# Patient Record
Sex: Male | Born: 1995 | Hispanic: Yes | Marital: Single | Smoking: Never smoker
Health system: Southern US, Community
[De-identification: ages and names within clinical notes are randomized; demographics above are authoritative.]

## PROBLEM LIST (undated history)

## (undated) DIAGNOSIS — F909 Attention-deficit hyperactivity disorder, unspecified type: Secondary | ICD-10-CM

## (undated) HISTORY — PX: TONSILLECTOMY: SUR1361

## (undated) HISTORY — DX: Attention-deficit hyperactivity disorder, unspecified type: F90.9

---
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
## 2015-05-25 ENCOUNTER — Ambulatory Visit (INDEPENDENT_AMBULATORY_CARE_PROVIDER_SITE_OTHER): Payer: Managed Care, Other (non HMO) | Admitting: Family Medicine

## 2015-05-25 ENCOUNTER — Encounter: Payer: Self-pay | Admitting: Family Medicine

## 2015-05-25 VITALS — BP 111/66 | HR 65 | Temp 98.7°F | Ht 65.5 in | Wt 243.2 lb

## 2015-05-25 DIAGNOSIS — E669 Obesity, unspecified: Secondary | ICD-10-CM | POA: Diagnosis not present

## 2015-05-25 DIAGNOSIS — G4700 Insomnia, unspecified: Secondary | ICD-10-CM | POA: Diagnosis not present

## 2015-05-25 DIAGNOSIS — E049 Nontoxic goiter, unspecified: Secondary | ICD-10-CM | POA: Insufficient documentation

## 2015-05-25 DIAGNOSIS — F902 Attention-deficit hyperactivity disorder, combined type: Secondary | ICD-10-CM | POA: Diagnosis not present

## 2015-05-25 DIAGNOSIS — Z0000 Encounter for general adult medical examination without abnormal findings: Secondary | ICD-10-CM

## 2015-05-25 DIAGNOSIS — G43009 Migraine without aura, not intractable, without status migrainosus: Secondary | ICD-10-CM

## 2015-05-25 DIAGNOSIS — F909 Attention-deficit hyperactivity disorder, unspecified type: Secondary | ICD-10-CM | POA: Insufficient documentation

## 2015-05-25 MED ORDER — TRAZODONE HCL 100 MG PO TABS: 50.0000 mg | ORAL_TABLET | Freq: Every evening | ORAL | Status: DC | PRN

## 2015-05-25 NOTE — Progress Notes (Signed)
BP 111/66 mmHg  Pulse 65  Temp(Src) 98.7 F (37.1 C)  Ht 5' 5.5" (1.664 m)  Wt 243 lb 3.2 oz (110.315 kg)  BMI 39.84 kg/m2  SpO2 99%   Subjective:    Patient ID: Peter Petersen, male    DOB: 03/26/1996, 19 y.o.   MRN: [id number]  HPI: Peter Petersen is a 19 y.o. male  Chief Complaint  Patient presents with  . Establish Care  . Medication Refill    ADHD medication  . Insomnia   He is here to officially establish care but says he had been here for sports physical back in high school (we have no record in our medical records system)  He started school this semester; the insomnia devastated him; he was using a lot of caffeine during school; mother thought maybe doing too much [employer]; then stopped coffee, now just occasionally; occasional soft drinks; no energy drinks; no hypersexuality, no gambling, no known bipolar in the family; when waking, does not feel rested; tried OTC sleep aides; no alcohol; no cigarettes; used to snore and had tonsils removed  He has been on ADHD medicine for years before; from 3rd grade to 9th grade; was not a big concern in high school did not need the medicine then; playing sports and had the energy outlet; treated by pediatrician, [facility]; he cannot recall the medicine or the dose Now [facility]; just does not have the time to exercise He is not sure if anyone else in the family has ADHD He took Metadate CD 30 mg, back in 2011 and 2012; he does not recall having any headaches or loss of appetite  He used to take medicine for migraines; he does not recall what that was; he does not get the aura; on and off, maybe 4-5 a month; the worst one recently was January in [place], bad one, grandfather passed away, lots of stress; photophobia and nausea; does not take medicine to sleep off the migraine; took pills to prevent them, prescribed by [facility]  Obesity; 190 pounds when he graduated from high school; he wasn't exercising as much as he  used to  Relevant past medical, surgical, family and social history reviewed and updated as indicated. Interim medical history since our last visit reviewed. No known family hx of diabetes Paternal grandmother has problem with blood circulation, not sure if clots or what exactly Allergies and medications reviewed and updated.  Review of Systems Per HPI unless specifically indicated above     Objective:    BP 111/66 mmHg  Pulse 65  Temp(Src) 98.7 F (37.1 C)  Ht 5' 5.5" (1.664 m)  Wt 243 lb 3.2 oz (110.315 kg)  BMI 39.84 kg/m2  SpO2 99%  Wt Readings from Last 3 Encounters:  05/25/15 243 lb 3.2 oz (110.315 kg) (99 %*, Z = 2.30)   * Growth percentiles are based on CDC 2-20 Years data.    Physical Exam  Constitutional: He appears well-developed and well-nourished. No distress.  obese  HENT:  Mouth/Throat: Mucous membranes are normal.  Eyes: EOM are normal. No scleral icterus.  Neck: Thyromegaly (prominent thyroid gland, symmetric, nontender) present.  Cardiovascular: Normal rate and regular rhythm.   Pulmonary/Chest: Effort normal and breath sounds normal.  Abdominal: Soft. He exhibits no distension (but obese). There is no tenderness.  Musculoskeletal: He exhibits no edema.  Neurological: He is alert. He displays no tremor.  Skin: No rash noted.  Psychiatric: He has a normal mood and  affect.      Assessment & Plan:   Problem List Items Addressed This Visit      Cardiovascular and Mediastinum   Migraine without aura    Request records from last primary to see what he used to take; avoid triggers      Relevant Medications   traZODone (DESYREL) 100 MG tablet     Endocrine   Goiter    Will start with labs; re-evaluate at f/u      Relevant Orders   T4, free (Completed)   T3, free (Completed)   TSH (Completed)   Thyroid peroxidase antibody (Completed)     Other   ADHD (attention deficit hyperactivity disorder)    Request records from previous primary; will  get labs today to make sure issues aren't related to thyroid disorder; will dicsuss risks/benefits of stimulant medication and consider starting at f/u after review of previous records; will need controlled substance contract at next visit      Insomnia - Primary    Will avoid benzos, sedative hypnotics; willing to use trazodone to help with sleep; good sleep hygiene      Obesity (BMI 35.0-39.9 without comorbidity) (HCC)    Stressed importance of healthy eating, increasing activity; see AVS; check thyroid function       Other Visit Diagnoses    Health maintenance examination        Relevant Orders    Lipid Panel w/o Chol/HDL Ratio (Completed)    Comprehensive metabolic panel (Completed)       Follow up plan: Return 2-3 weeks, for complete physical and other issues.  An after-visit summary was printed and given to the patient at check-out.  Please see the patient instructions which may contain other information and recommendations beyond what is mentioned above in the assessment and plan. Orders Placed This Encounter  Procedures  . T4, free  . T3, free  . Lipid Panel w/o Chol/HDL Ratio  . TSH  . Comprehensive metabolic panel  . Thyroid peroxidase antibody

## 2015-05-25 NOTE — Patient Instructions (Addendum)
Try the new medicine at bedtime called trazodone for sleep  Check out the information at familydoctor.org entitled "What It Takes to Lose Weight" Try to lose between 1-2 pounds per week by taking in fewer calories and burning off more calories You can succeed by limiting portions, limiting foods dense in calories and fat, becoming more active, and drinking 8 glasses of water a day (64 ounces) Don't skip meals, especially breakfast, as skipping meals may alter your metabolism Do not use over-the-counter weight loss pills or gimmicks that claim rapid weight loss A healthy BMI (or body mass index) is between 18.5 and 24.9 You can calculate your ideal BMI at the NIH website JobEconomics.huhttp://www.nhlbi.nih.gov/health/educational/lose_wt/BMI/bmicalc.htm Myfitnesspal Try to limit your daily calories to 2200 kcal a day Request information (records) from [facility] Return for a physical in the next few weeks and also to see how the sleep medicine worked and to start ADHD medicine, 45 minute block  Insomnia Insomnia is a sleep disorder that makes it difficult to fall asleep or to stay asleep. Insomnia can cause tiredness (fatigue), low energy, difficulty concentrating, mood swings, and poor performance at work or school.  There are three different ways to classify insomnia:  Difficulty falling asleep.  Difficulty staying asleep.  Waking up too early in the morning. Any type of insomnia can be long-term (chronic) or short-term (acute). Both are common. Short-term insomnia usually lasts for three months or less. Chronic insomnia occurs at least three times a week for longer than three months. CAUSES  Insomnia may be caused by another condition, situation, or substance, such as:  Anxiety.  Certain medicines.  Gastroesophageal reflux disease (GERD) or other gastrointestinal conditions.  Asthma or other breathing conditions.  Restless legs syndrome, sleep apnea, or other sleep disorders.  Chronic  pain.  Menopause. This may include hot flashes.  Stroke.  Abuse of alcohol, tobacco, or illegal drugs.  Depression.  Caffeine.   Neurological disorders, such as Alzheimer disease.  An overactive thyroid (hyperthyroidism). The cause of insomnia may not be known. RISK FACTORS Risk factors for insomnia include:  Gender. Women are more commonly affected than men.  Age. Insomnia is more common as you get older.  Stress. This may involve your professional or personal life.  Income. Insomnia is more common in people with lower income.  Lack of exercise.   Irregular work schedule or night shifts.  Traveling between different time zones. SIGNS AND SYMPTOMS If you have insomnia, trouble falling asleep or trouble staying asleep is the main symptom. This may lead to other symptoms, such as:  Feeling fatigued.  Feeling nervous about going to sleep.  Not feeling rested in the morning.  Having trouble concentrating.  Feeling irritable, anxious, or depressed. TREATMENT  Treatment for insomnia depends on the cause. If your insomnia is caused by an underlying condition, treatment will focus on addressing the condition. Treatment may also include:   Medicines to help you sleep.  Counseling or therapy.  Lifestyle adjustments. HOME CARE INSTRUCTIONS   Take medicines only as directed by your health care provider.  Keep regular sleeping and waking hours. Avoid naps.  Keep a sleep diary to help you and your health care provider figure out what could be causing your insomnia. Include:   When you sleep.  When you wake up during the night.  How well you sleep.   How rested you feel the next day.  Any side effects of medicines you are taking.  What you eat and drink.  Make your bedroom a comfortable place where it is easy to fall asleep:  Put up shades or special blackout curtains to block light from outside.  Use a white noise machine to block noise.  Keep the  temperature cool.   Exercise regularly as directed by your health care provider. Avoid exercising right before bedtime.  Use relaxation techniques to manage stress. Ask your health care provider to suggest some techniques that may work well for you. These may include:  Breathing exercises.  Routines to release muscle tension.  Visualizing peaceful scenes.  Cut back on alcohol, caffeinated beverages, and cigarettes, especially close to bedtime. These can disrupt your sleep.  Do not overeat or eat spicy foods right before bedtime. This can lead to digestive discomfort that can make it hard for you to sleep.  Limit screen use before bedtime. This includes:  Watching TV.  Using your smartphone, tablet, and computer.  Stick to a routine. This can help you fall asleep faster. Try to do a quiet activity, brush your teeth, and go to bed at the same time each night.  Get out of bed if you are still awake after 15 minutes of trying to sleep. Keep the lights down, but try reading or doing a quiet activity. When you feel sleepy, go back to bed.  Make sure that you drive carefully. Avoid driving if you feel very sleepy.  Keep all follow-up appointments as directed by your health care provider. This is important. SEEK MEDICAL CARE IF:   You are tired throughout the day or have trouble in your daily routine due to sleepiness.  You continue to have sleep problems or your sleep problems get worse. SEEK IMMEDIATE MEDICAL CARE IF:   You have serious thoughts about hurting yourself or someone else.   This information is not intended to replace advice given to you by your health care provider. Make sure you discuss any questions you have with your health care provider.   Document Released: 07/11/2000 Document Revised: 04/04/2015 Document Reviewed: 04/14/2014 Elsevier Interactive Patient Education [employer]

## 2015-05-26 ENCOUNTER — Encounter: Payer: Self-pay | Admitting: Family Medicine

## 2015-05-26 DIAGNOSIS — E786 Lipoprotein deficiency: Secondary | ICD-10-CM | POA: Insufficient documentation

## 2015-05-26 LAB — TSH: TSH: 1.21 u[IU]/mL (ref 0.450–4.500)

## 2015-05-26 LAB — COMPREHENSIVE METABOLIC PANEL
A/G RATIO: 1.7 (ref 1.1–2.5)
ALT: 24 IU/L (ref 0–44)
AST: 34 IU/L (ref 0–40)
Albumin: 4.5 g/dL (ref 3.5–5.5)
Alkaline Phosphatase: 99 IU/L (ref 39–117)
BUN/Creatinine Ratio: 17 (ref 8–19)
BUN: 15 mg/dL (ref 6–20)
Bilirubin Total: 0.7 mg/dL (ref 0.0–1.2)
CALCIUM: 9.6 mg/dL (ref 8.7–10.2)
CO2: 25 mmol/L (ref 18–29)
CREATININE: 0.86 mg/dL (ref 0.76–1.27)
Chloride: 100 mmol/L (ref 97–106)
GFR calc Af Amer: 145 mL/min/{1.73_m2} (ref >59)
GFR, EST NON AFRICAN AMERICAN: 126 mL/min/{1.73_m2} (ref >59)
Globulin, Total: 2.6 g/dL (ref 1.5–4.5)
Glucose: 85 mg/dL (ref 65–99)
Potassium: 5 mmol/L (ref 3.5–5.2)
Sodium: 140 mmol/L (ref 136–144)
Total Protein: 7.1 g/dL (ref 6.0–8.5)

## 2015-05-26 LAB — T4, FREE: Free T4: 1.17 ng/dL (ref 0.93–1.60)

## 2015-05-26 LAB — THYROID PEROXIDASE ANTIBODY: Thyroperoxidase Ab SerPl-aCnc: 8 IU/mL (ref 0–26)

## 2015-05-26 LAB — LIPID PANEL W/O CHOL/HDL RATIO
Cholesterol, Total: 140 mg/dL (ref 100–169)
HDL: 34 mg/dL — ABNORMAL LOW (ref >39)
LDL CALC: 81 mg/dL (ref 0–109)
Triglycerides: 125 mg/dL — ABNORMAL HIGH (ref 0–89)
VLDL Cholesterol Cal: 25 mg/dL (ref 5–40)

## 2015-05-26 LAB — T3, FREE: T3, Free: 3.9 pg/mL (ref 2.3–5.0)

## 2015-05-30 NOTE — Assessment & Plan Note (Signed)
Will avoid benzos, sedative hypnotics; willing to use trazodone to help with sleep; good sleep hygiene

## 2015-05-30 NOTE — Assessment & Plan Note (Signed)
Stressed importance of healthy eating, increasing activity; see AVS; check thyroid function

## 2015-05-30 NOTE — Assessment & Plan Note (Signed)
Request records from previous primary; will get labs today to make sure issues aren't related to thyroid disorder; will dicsuss risks/benefits of stimulant medication and consider starting at f/u after review of previous records; will need controlled substance contract at next visit

## 2015-05-30 NOTE — Assessment & Plan Note (Signed)
Will start with labs; re-evaluate at f/u

## 2015-05-30 NOTE — Assessment & Plan Note (Signed)
Request records from last primary to see what he used to take; avoid triggers

## 2015-06-13 ENCOUNTER — Encounter: Payer: Self-pay | Admitting: Family Medicine

## 2015-06-13 ENCOUNTER — Ambulatory Visit (INDEPENDENT_AMBULATORY_CARE_PROVIDER_SITE_OTHER): Payer: Managed Care, Other (non HMO) | Admitting: Family Medicine

## 2015-06-13 VITALS — BP 117/69 | HR 64 | Temp 97.1°F | Ht 66.5 in | Wt 246.0 lb

## 2015-06-13 DIAGNOSIS — G43009 Migraine without aura, not intractable, without status migrainosus: Secondary | ICD-10-CM | POA: Diagnosis not present

## 2015-06-13 DIAGNOSIS — Z0000 Encounter for general adult medical examination without abnormal findings: Secondary | ICD-10-CM

## 2015-06-13 DIAGNOSIS — Z114 Encounter for screening for human immunodeficiency virus [HIV]: Secondary | ICD-10-CM

## 2015-06-13 DIAGNOSIS — R040 Epistaxis: Secondary | ICD-10-CM

## 2015-06-13 DIAGNOSIS — F321 Major depressive disorder, single episode, moderate: Secondary | ICD-10-CM | POA: Diagnosis not present

## 2015-06-13 DIAGNOSIS — G245 Blepharospasm: Secondary | ICD-10-CM

## 2015-06-13 DIAGNOSIS — E669 Obesity, unspecified: Secondary | ICD-10-CM

## 2015-06-13 DIAGNOSIS — Z113 Encounter for screening for infections with a predominantly sexual mode of transmission: Secondary | ICD-10-CM | POA: Diagnosis not present

## 2015-06-13 DIAGNOSIS — F902 Attention-deficit hyperactivity disorder, combined type: Secondary | ICD-10-CM | POA: Diagnosis not present

## 2015-06-13 MED ORDER — METHYLPHENIDATE HCL ER (CD) 20 MG PO CPCR: 20.0000 mg | ORAL_CAPSULE | ORAL | Status: DC

## 2015-06-13 MED ORDER — ESCITALOPRAM OXALATE 10 MG PO TABS: 10.0000 mg | ORAL_TABLET | Freq: Every day | ORAL | Status: DC

## 2015-06-13 MED ORDER — NORTRIPTYLINE HCL 10 MG PO CAPS: 10.0000 mg | ORAL_CAPSULE | Freq: Every day | ORAL | Status: DC

## 2015-06-13 NOTE — Assessment & Plan Note (Signed)
Start metadate CD; return in 3 weeks for reassessment

## 2015-06-13 NOTE — Assessment & Plan Note (Signed)
Start nortriptyline, keep record of headaches, return in 3 weeks

## 2015-06-13 NOTE — Assessment & Plan Note (Addendum)
USPSTF grade A and B recommendations reviewed with patient; age-appropriate recommendations, preventive care, screening tests, etc discussed and encouraged; healthy living encouraged; see AVS for patient education given to patient; he refuses flu shot; he refuses tetanus booster; see AVS; encouraged regular STE and refer to urology for any new lumps

## 2015-06-13 NOTE — Patient Instructions (Addendum)
Please do start the new medicine and take in the morning; if it makes you tired, then take it at night instead Do seek immediate help (call 911 or go to the ER) if any dark thoughts or thoughts of hurting yourself Return to see me in 3 weeks Check out the information at familydoctor.org entitled "What It Takes to Lose Weight" Try to lose between 1-2 pounds per week by taking in fewer calories and burning off more calories You can succeed by limiting portions, limiting foods dense in calories and fat, becoming more active, and drinking 8 glasses of water a day (64 ounces) Don't skip meals, especially breakfast, as skipping meals may alter your metabolism Do not use over-the-counter weight loss pills or gimmicks that claim rapid weight loss A healthy BMI (or body mass index) is between 18.5 and 24.9 You can calculate your ideal BMI at the NIH website JobEconomics.huhttp://www.nhlbi.nih.gov/health/educational/lose_wt/BMI/bmicalc.htm  I do recommend yearly flu shots; for individuals who don't want flu shots, try to practice excellent hand hygiene, and avoid nursing homes, day cares, and hospitals during peak flu season; taking 1000 mg of vitamin C daily during flu/cold season may help boost your immune system too  Start the new medicine at night for migraines; keep a record of how many you have and we can adjust the dose at your next appointment  Start the new medicine for ADHD; we'll see how that is working at follow-up as well  Return in 3 weeks for medicine follow-up Return in 12+ months for your next physical  Health Maintenance, Male A healthy lifestyle and preventative care can promote health and wellness.  Maintain regular health, dental, and eye exams.  Eat a healthy diet. Foods like vegetables, fruits, whole grains, low-fat dairy products, and lean protein foods contain the nutrients you need and are low in calories. Decrease your intake of foods high in solid fats, added sugars, and salt. Get  information about a proper diet from your health care provider, if necessary.  Regular physical exercise is one of the most important things you can do for your health. Most adults should get at least 150 minutes of moderate-intensity exercise (any activity that increases your heart rate and causes you to sweat) each week. In addition, most adults need muscle-strengthening exercises on 2 or more days a week.   Maintain a healthy weight. The body mass index (BMI) is a screening tool to identify possible weight problems. It provides an estimate of body fat based on height and weight. Your health care provider can find your BMI and can help you achieve or maintain a healthy weight. For males 20 years and older:  A BMI below 18.5 is considered underweight.  A BMI of 18.5 to 24.9 is normal.  A BMI of 25 to 29.9 is considered overweight.  A BMI of 30 and above is considered obese.  Maintain normal blood lipids and cholesterol by exercising and minimizing your intake of saturated fat. Eat a balanced diet with plenty of fruits and vegetables. Blood tests for lipids and cholesterol should begin at age 19 and be repeated every 5 years. If your lipid or cholesterol levels are high, you are over age 19, or you are at high risk for heart disease, you may need your cholesterol levels checked more frequently.Ongoing high lipid and cholesterol levels should be treated with medicines if diet and exercise are not working.  If you smoke, find out from your health care provider how to quit. If you do not  use tobacco, do not start.  Lung cancer screening is recommended for adults aged 55-80 years who are at high risk for developing lung cancer because of a history of smoking. A yearly low-dose CT scan of the lungs is recommended for people who have at least a 30-pack-year history of smoking and are current smokers or have quit within the past 15 years. A pack year of smoking is smoking an average of 1 pack of  cigarettes a day for 1 year (for example, a 30-pack-year history of smoking could mean smoking 1 pack a day for 30 years or 2 packs a day for 15 years). Yearly screening should continue until the smoker has stopped smoking for at least 15 years. Yearly screening should be stopped for people who develop a health problem that would prevent them from having lung cancer treatment.  If you choose to drink alcohol, do not have more than 2 drinks per day. One drink is considered to be 12 oz (360 mL) of beer, 5 oz (150 mL) of wine, or 1.5 oz (45 mL) of liquor.  Avoid the use of street drugs. Do not share needles with anyone. Ask for help if you need support or instructions about stopping the use of drugs.  High blood pressure causes heart disease and increases the risk of stroke. High blood pressure is more likely to develop in:  People who have blood pressure in the end of the normal range (100-139/85-89 mm Hg).  People who are overweight or obese.  People who are African American.  If you are 16-46 years of age, have your blood pressure checked every 3-5 years. If you are 3 years of age or older, have your blood pressure checked every year. You should have your blood pressure measured twice--once when you are at a hospital or clinic, and once when you are not at a hospital or clinic. Record the average of the two measurements. To check your blood pressure when you are not at a hospital or clinic, you can use:  An automated blood pressure machine at a pharmacy.  A home blood pressure monitor.  If you are 58-54 years old, ask your health care provider if you should take aspirin to prevent heart disease.  Diabetes screening involves taking a blood sample to check your fasting blood sugar level. This should be done once every 3 years after age 9 if you are at a normal weight and without risk factors for diabetes. Testing should be considered at a younger age or be carried out more frequently if you are  overweight and have at least 1 risk factor for diabetes.  Colorectal cancer can be detected and often prevented. Most routine colorectal cancer screening begins at the age of 43 and continues through age 88. However, your health care provider may recommend screening at an earlier age if you have risk factors for colon cancer. On a yearly basis, your health care provider may provide home test kits to check for hidden blood in the stool. A small camera at the end of a tube may be used to directly examine the colon (sigmoidoscopy or colonoscopy) to detect the earliest forms of colorectal cancer. Talk to your health care provider about this at age 41 when routine screening begins. A direct exam of the colon should be repeated every 5-10 years through age 47, unless early forms of precancerous polyps or small growths are found.  People who are at an increased risk for hepatitis B should be screened  for this virus. You are considered at high risk for hepatitis B if:  You were born in a country where hepatitis B occurs often. Talk with your health care provider about which countries are considered high risk.  Your parents were born in a high-risk country and you have not received a shot to protect against hepatitis B (hepatitis B vaccine).  You have HIV or AIDS.  You use needles to inject street drugs.  You live with, or have sex with, someone who has hepatitis B.  You are a man who has sex with other men (MSM).  You get hemodialysis treatment.  You take certain medicines for conditions like cancer, organ transplantation, and autoimmune conditions.  Hepatitis C blood testing is recommended for all people born from [birth year] through 1965 and any individual with known risk factors for hepatitis C.  Healthy men should no longer receive prostate-specific antigen (PSA) blood tests as part of routine cancer screening. Talk to your health care provider about prostate cancer screening.  Testicular cancer  screening is not recommended for adolescents or adult males who have no symptoms. Screening includes self-exam, a health care provider exam, and other screening tests. Consult with your health care provider about any symptoms you have or any concerns you have about testicular cancer.  Practice safe sex. Use condoms and avoid high-risk sexual practices to reduce the spread of sexually transmitted infections (STIs).  You should be screened for STIs, including gonorrhea and chlamydia if:  You are sexually active and are younger than 24 years.  You are older than 24 years, and your health care provider tells you that you are at risk for this type of infection.  Your sexual activity has changed since you were last screened, and you are at an increased risk for chlamydia or gonorrhea. Ask your health care provider if you are at risk.  If you are at risk of being infected with HIV, it is recommended that you take a prescription medicine daily to prevent HIV infection. This is called pre-exposure prophylaxis (PrEP). You are considered at risk if:  You are a man who has sex with other men (MSM).  You are a heterosexual man who is sexually active with multiple partners.  You take drugs by injection.  You are sexually active with a partner who has HIV.  Talk with your health care provider about whether you are at high risk of being infected with HIV. If you choose to begin PrEP, you should first be tested for HIV. You should then be tested every 3 months for as long as you are taking PrEP.  Use sunscreen. Apply sunscreen liberally and repeatedly throughout the day. You should seek shade when your shadow is shorter than you. Protect yourself by wearing long sleeves, pants, a wide-brimmed hat, and sunglasses year round whenever you are outdoors.  Tell your health care provider of new moles or changes in moles, especially if there is a change in shape or color. Also, tell your health care provider if a  mole is larger than the size of a pencil eraser.  A one-time screening for abdominal aortic aneurysm (AAA) and surgical repair of large AAAs by ultrasound is recommended for men aged 65-75 years who are current or former smokers.  Stay current with your vaccines (immunizations).   This information is not intended to replace advice given to you by your health care provider. Make sure you discuss any questions you have with your health care provider.   Document  Released: 01/10/2008 Document Revised: 08/04/2014 Document Reviewed: 12/09/2010 Elsevier Interactive Patient Education [employer]

## 2015-06-13 NOTE — Assessment & Plan Note (Signed)
Encouraged weight loss, activity; see AVS

## 2015-06-13 NOTE — Addendum Note (Signed)
Addended byZella Ball: WORKMAN, AMY J on: 06/13/2015 09:00 AM   Modules accepted: Orders, SmartSet

## 2015-06-13 NOTE — Progress Notes (Signed)
Patient ID: Peter Petersen, male   DOB: [birth year], 19 y.o.   MRN: [id number]   Subjective:   Peter Petersen is a 19 y.o. male here for a complete physical exam  Interim issues since last visit:   USPSTF grade A and B recommendations Alcohol: none Depression:  Depression screen [facility] 2/9 06/13/2015 06/13/2015  Decreased Interest 2 2  Down, Depressed, Hopeless 1 1  PHQ - 2 Score 3 3  Altered sleeping 3 -  Tired, decreased energy 3 -  Change in appetite 3 -  Feeling bad or failure about yourself  2 -  Trouble concentrating 3 -  Moving slowly or fidgety/restless 2 -  Suicidal thoughts 0 -  PHQ-9 Score 19 -   Depression screen [facility] 2/9 06/13/2015 06/13/2015  Decreased Interest 2 2  Down, Depressed, Hopeless 1 1  PHQ - 2 Score 3 3  Altered sleeping 3 -  Tired, decreased energy 3 -  Change in appetite 3 -  Feeling bad or failure about yourself  2 -  Trouble concentrating 3 -  Moving slowly or fidgety/restless 2 -  Suicidal thoughts 0 -  PHQ-9 Score 19 -  Family hx: negative for depression and anxiety; but then he says uncle (paternal) committed suicide, but he was alcoholic he thinks, not sure if depressed  He has been through episodes of depression before, did therapy; not interested in therapy Hypertension: controlled Obesity: relatively stable Tobacco use: nonsmoker HIV, hep B, hep C: no unprotected sex; he is open to testing STD testing and prevention (chl/gon/syphilis): GC, chl; no questions about how to stay safe Lipids: low HDL Glucose: normal, 85 2 weeks ago Diet: tried changing diet Exercise: increasing activity, going to gym Skin cancer: no tanning beds, no worrisome moles  Migraines and ADHD; used to take metadate CD 30 mg; would like to start something now, classes will be starting in a few months Migraines more than 10, less than 20 per month; used to take daily medicine, can't remember what   Past Medical History  Diagnosis Date  . ADHD (attention deficit  hyperactivity disorder)    Past Surgical History  Procedure Laterality Date  . Tonsillectomy     Family History  Problem Relation Age of Onset  . Heart disease Maternal Grandfather   . Cancer Neg Hx   . Diabetes Neg Hx   . Hypertension Neg Hx   . Stroke Neg Hx    Social History  Substance Use Topics  . Smoking status: Never Smoker   . Smokeless tobacco: Never Used  . Alcohol Use: No   Review of Systems  Constitutional: Negative for fever and unexpected weight change.  HENT: Positive for nosebleeds and rhinorrhea (cold right now). Negative for dental problem and hearing loss.   Eyes: Positive for visual disturbance (wears glasses at night, close vision).  Respiratory: Negative for shortness of breath and wheezing.   Cardiovascular: Negative for chest pain and leg swelling.  Gastrointestinal: Negative for blood in stool.  Endocrine: Negative for polydipsia and polyuria.  Genitourinary: Negative for hematuria.  Musculoskeletal: Negative for joint swelling.  Skin:       No worrisome moles  Allergic/Immunologic: Negative for food allergies.  Neurological: Positive for tremors (occasionally, spasms in the muscles occasionally, blepharospasm). Negative for speech difficulty.  Psychiatric/Behavioral: Positive for dysphoric mood.    Objective:   Filed Vitals:   06/13/15 0802  BP: 117/69  Pulse: 64  Temp: 97.1 F (36.2 C)  Height: 5' 6.5" (1.689  m)  Weight: 246 lb (111.585 kg)  SpO2: 98%   Body mass index is 39.12 kg/(m^2). Wt Readings from Last 3 Encounters:  06/13/15 246 lb (111.585 kg) (99 %*, Z = 2.35)  05/25/15 243 lb 3.2 oz (110.315 kg) (99 %*, Z = 2.30)   * Growth percentiles are based on CDC 2-20 Years data.   Physical Exam  Constitutional: He appears well-developed and well-nourished. No distress.  HENT:  Head: Normocephalic and atraumatic.  Nose: Nose normal.  Mouth/Throat: Oropharynx is clear and moist.  Eyes: EOM are normal. No scleral icterus.  Neck:  No JVD present. No thyromegaly present.  Cardiovascular: Normal rate, regular rhythm and normal heart sounds.   Pulmonary/Chest: Effort normal and breath sounds normal. No respiratory distress. He has no wheezes. He has no rales.  Abdominal: Soft. Bowel sounds are normal. He exhibits no distension. There is no tenderness. There is no guarding.  Musculoskeletal: Normal range of motion. He exhibits no edema.  Lymphadenopathy:    He has no cervical adenopathy.  Neurological: He is alert. He displays normal reflexes. He exhibits normal muscle tone. Coordination normal.  Skin: Skin is warm and dry. No rash noted. He is not diaphoretic. No erythema. No pallor.  Psychiatric: He has a normal mood and affect. His behavior is normal. Judgment and thought content normal.    Assessment/Plan:   Problem List Items Addressed This Visit      Cardiovascular and Mediastinum   Migraine without aura    Start nortriptyline, keep record of headaches, return in 3 weeks      Relevant Medications   escitalopram (LEXAPRO) 10 MG tablet   nortriptyline (PAMELOR) 10 MG capsule     Respiratory   Epistaxis   Relevant Orders   PT and PTT   CBC with Differential/Platelet     Nervous and Auditory   Blepharospasm   Relevant Orders   Magnesium     Other   ADHD (attention deficit hyperactivity disorder)    Start metadate CD; return in 3 weeks for reassessment      Obesity (BMI 35.0-39.9 without comorbidity) (HCC)    Encouraged weight loss, activity; see AVS      Relevant Medications   methylphenidate (METADATE CD) 20 MG CR capsule   Preventative health care - Primary    USPSTF grade A and B recommendations reviewed with patient; age-appropriate recommendations, preventive care, screening tests, etc discussed and encouraged; healthy living encouraged; see AVS for patient education given to patient; he refuses flu shot; he refuses tetanus booster; see AVS; encouraged regular STE and refer to urology for any  new lumps      Screening for HIV without presence of risk factors   Relevant Orders   HIV antibody   Screen for STD (sexually transmitted disease)   Relevant Orders   GC/chlamydia probe amp, urine   Depression, major, single episode, moderate (HCC)    Patient refused counseling; not suicidal; he agrees to call 911 or go to the ER if any thoughts of self-harm; start antidepressant; no hx of mania or hypomania per hx; recheck PHQ scores at f/u      Relevant Medications   escitalopram (LEXAPRO) 10 MG tablet   nortriptyline (PAMELOR) 10 MG capsule   Other Relevant Orders   VITAMIN D 25 Hydroxy (Vit-D Deficiency, Fractures)      Meds ordered this encounter  Medications  . escitalopram (LEXAPRO) 10 MG tablet    Sig: Take 1 tablet (10 mg total) by mouth daily.  Dispense:  30 tablet    Refill:  0  . methylphenidate (METADATE CD) 20 MG CR capsule    Sig: Take 1 capsule (20 mg total) by mouth every morning.    Dispense:  30 capsule    Refill:  0  . nortriptyline (PAMELOR) 10 MG capsule    Sig: Take 1 capsule (10 mg total) by mouth at bedtime.    Dispense:  30 capsule    Refill:  0   Orders Placed This Encounter  Procedures  . Magnesium  . PT and PTT  . GC/chlamydia probe amp, urine  . HIV antibody  . CBC with Differential/Platelet  . VITAMIN D 25 Hydroxy (Vit-D Deficiency, Fractures)    Follow up plan: Return in about 3 weeks (around 07/04/2015) for follow-up; one year for physical.  An after-visit summary was printed and given to the patient at check-out.  Please see the patient instructions which may contain other information and recommendations beyond what is mentioned above in the assessment and plan.  Meds ordered this encounter  Medications  . escitalopram (LEXAPRO) 10 MG tablet    Sig: Take 1 tablet (10 mg total) by mouth daily.    Dispense:  30 tablet    Refill:  0  . methylphenidate (METADATE CD) 20 MG CR capsule    Sig: Take 1 capsule (20 mg total) by mouth  every morning.    Dispense:  30 capsule    Refill:  0  . nortriptyline (PAMELOR) 10 MG capsule    Sig: Take 1 capsule (10 mg total) by mouth at bedtime.    Dispense:  30 capsule    Refill:  0    Orders Placed This Encounter  Procedures  . Magnesium  . PT and PTT  . GC/chlamydia probe amp, urine  . HIV antibody  . CBC with Differential/Platelet  . VITAMIN D 25 Hydroxy (Vit-D Deficiency, Fractures)

## 2015-06-13 NOTE — Assessment & Plan Note (Signed)
Patient refused counseling; not suicidal; he agrees to call 911 or go to the ER if any thoughts of self-harm; start antidepressant; no hx of mania or hypomania per hx; recheck PHQ scores at f/u

## 2015-06-14 LAB — CBC WITH DIFFERENTIAL/PLATELET
BASOS ABS: 0 10*3/uL (ref 0.0–0.2)
BASOS: 0 %
EOS (ABSOLUTE): 0.1 10*3/uL (ref 0.0–0.4)
Eos: 2 %
Hematocrit: 43.8 % (ref 37.5–51.0)
Hemoglobin: 14.7 g/dL (ref 12.6–17.7)
Immature Grans (Abs): 0 10*3/uL (ref 0.0–0.1)
Immature Granulocytes: 0 %
Lymphocytes Absolute: 2.3 10*3/uL (ref 0.7–3.1)
Lymphs: 35 %
MCH: 29.3 pg (ref 26.6–33.0)
MCHC: 33.6 g/dL (ref 31.5–35.7)
MCV: 87 fL (ref 79–97)
MONOS ABS: 0.5 10*3/uL (ref 0.1–0.9)
Monocytes: 8 %
NEUTROS ABS: 3.7 10*3/uL (ref 1.4–7.0)
NEUTROS PCT: 55 %
PLATELETS: 281 10*3/uL (ref 150–379)
RBC: 5.01 x10E6/uL (ref 4.14–5.80)
RDW: 13.1 % (ref 12.3–15.4)
WBC: 6.7 10*3/uL (ref 3.4–10.8)

## 2015-06-14 LAB — MAGNESIUM: MAGNESIUM: 2.1 mg/dL (ref 1.6–2.3)

## 2015-06-14 LAB — PT AND PTT
APTT: 33 s (ref 24–33)
INR: 1 (ref 0.8–1.2)
PROTHROMBIN TIME: 10.2 s (ref 9.1–12.0)

## 2015-06-14 LAB — GC/CHLAMYDIA PROBE AMP
Chlamydia trachomatis, NAA: NEGATIVE
Neisseria gonorrhoeae by PCR: NEGATIVE

## 2015-06-14 LAB — VITAMIN D 25 HYDROXY (VIT D DEFICIENCY, FRACTURES): VIT D 25 HYDROXY: 13.7 ng/mL — AB (ref 30.0–100.0)

## 2015-06-14 LAB — HIV ANTIBODY (ROUTINE TESTING W REFLEX): HIV SCREEN 4TH GENERATION: NONREACTIVE

## 2015-06-19 ENCOUNTER — Telehealth: Payer: Self-pay | Admitting: Family Medicine

## 2015-06-19 DIAGNOSIS — E559 Vitamin D deficiency, unspecified: Secondary | ICD-10-CM

## 2015-06-19 MED ORDER — VITAMIN D (ERGOCALCIFEROL) 1.25 MG (50000 UNIT) PO CAPS: 50000.0000 [IU] | ORAL_CAPSULE | ORAL | Status: DC

## 2015-06-19 NOTE — Telephone Encounter (Signed)
No answer. Left message on patient's voicemail to return my call.

## 2015-06-19 NOTE — Telephone Encounter (Signed)
Let patient know please that his vitamin D is really low; that can cause fatigue and feeling down; start new prescription vitamin D once a week for four weeks, then 1,000 iu vitamin D3 daily (he can get that OTC) His other tests all look very good Thank you

## 2015-06-20 NOTE — Telephone Encounter (Signed)
Patient notified about Vitamin D and medications were explained. He said he had no questions. I told him to call us if he did.

## 2015-07-04 ENCOUNTER — Ambulatory Visit (INDEPENDENT_AMBULATORY_CARE_PROVIDER_SITE_OTHER): Payer: Managed Care, Other (non HMO) | Admitting: Family Medicine

## 2015-07-04 ENCOUNTER — Encounter: Payer: Self-pay | Admitting: Family Medicine

## 2015-07-04 VITALS — BP 110/69 | HR 66 | Wt 245.0 lb

## 2015-07-04 DIAGNOSIS — G4710 Hypersomnia, unspecified: Secondary | ICD-10-CM

## 2015-07-04 DIAGNOSIS — F902 Attention-deficit hyperactivity disorder, combined type: Secondary | ICD-10-CM

## 2015-07-04 DIAGNOSIS — E049 Nontoxic goiter, unspecified: Secondary | ICD-10-CM

## 2015-07-04 DIAGNOSIS — G43009 Migraine without aura, not intractable, without status migrainosus: Secondary | ICD-10-CM

## 2015-07-04 DIAGNOSIS — E559 Vitamin D deficiency, unspecified: Secondary | ICD-10-CM | POA: Diagnosis not present

## 2015-07-04 DIAGNOSIS — F321 Major depressive disorder, single episode, moderate: Secondary | ICD-10-CM | POA: Diagnosis not present

## 2015-07-04 DIAGNOSIS — R400 Somnolence: Secondary | ICD-10-CM

## 2015-07-04 MED ORDER — ESCITALOPRAM OXALATE 10 MG PO TABS: 15.0000 mg | ORAL_TABLET | Freq: Every day | ORAL | Status: DC

## 2015-07-04 NOTE — Progress Notes (Signed)
BP 110/69 mmHg  Pulse 66  Wt 245 lb (111.131 kg)  SpO2 98%   Subjective:    Patient ID: Peter Petersen, male    DOB: 12/09/1995, 19 y.o.   MRN: [id number]  HPI: Peter Petersen is a 19 y.o. male  Chief Complaint  Patient presents with  . ADHD    3 week follow up, he has not started the new medicine yet.   Patient here for ADHD follow-up; he will be starting the medicine when classes get underway  He is really tired in the morning; every morning; he does not wake up in the middle of the night; no coughing or sputtering or gasping at night; no body jerking as he falls asleep; he used to snore, but when tonsils were removed, that stopped; no episodes of known apnea No known family hx of sleep apnea Weight is same range He has low vitamin D, taking 50k units every Sunday No thoughts of self-harm or thoughts of hurting others; denies hallucinations, voices, etc.  He feels like his mood is doing better; went up a little and then plateaued; no headaches or nausea  The migraine medicine is working well; a few non-migraine headaches, just stress  Does not drive when tired  Relevant past medical, surgical, family and social history reviewed and updated as indicated. Past Surgical History  Procedure Laterality Date  . Tonsillectomy      Interim medical history since our last visit reviewed. Allergies and medications reviewed and updated.  Review of Systems Per HPI unless specifically indicated above     Objective:    BP 110/69 mmHg  Pulse 66  Wt 245 lb (111.131 kg)  SpO2 98%  Wt Readings from Last 3 Encounters:  07/04/15 245 lb (111.131 kg) (99 %*, Z = 2.33)  06/13/15 246 lb (111.585 kg) (99 %*, Z = 2.35)  05/25/15 243 lb 3.2 oz (110.315 kg) (99 %*, Z = 2.30)   * Growth percentiles are based on CDC 2-20 Years data.    Physical Exam  Constitutional: He appears well-developed and well-nourished. No distress.  Eyes: EOM are normal. Right eye exhibits no discharge. Left eye  exhibits no discharge. No scleral icterus.  No proptosis or exophthalmos  Neck: Thyromegaly (thick obese neck, not sure if true goiter or just obese body habitus) present.  Cardiovascular: Normal rate and regular rhythm.   No murmur heard. Pulmonary/Chest: Effort normal and breath sounds normal.  Abdominal: He exhibits no distension.  Musculoskeletal: He exhibits no edema.  Neurological: He is alert. He displays no tremor. Gait normal.  Skin: No pallor.  Psychiatric: He has a normal mood and affect. His speech is normal. He is not slowed and not withdrawn. He does not express impulsivity or inappropriate judgment. He does not exhibit a depressed mood. He expresses no homicidal and no suicidal ideation.  Somewhat blunted affect; fair to good eye contact with examiner; did not spend as much time looking at his phone this visit   Results for orders placed or performed in visit on 06/13/15  GC/Chlamydia Probe Amp  Result Value Ref Range   Chlamydia trachomatis, NAA Negative Negative   Neisseria gonorrhoeae by PCR Negative Negative  Magnesium  Result Value Ref Range   Magnesium 2.1 1.6 - 2.3 mg/dL  PT and PTT  Result Value Ref Range   INR 1.0 0.8 - 1.2   Prothrombin Time 10.2 9.1 - 12.0 sec   aPTT 33 24 - 33 sec  HIV antibody  Result Value Ref Range   HIV Screen 4th Generation wRfx Non Reactive Non Reactive  CBC with Differential/Platelet  Result Value Ref Range   WBC 6.7 3.4 - 10.8 x10E3/uL   RBC 5.01 4.14 - 5.80 x10E6/uL   Hemoglobin 14.7 12.6 - 17.7 g/dL   Hematocrit 40.9 81.1 - 51.0 %   MCV 87 79 - 97 fL   MCH 29.3 26.6 - 33.0 pg   MCHC 33.6 31.5 - 35.7 g/dL   RDW 91.4 78.2 - 95.6 %   Platelets 281 150 - 379 x10E3/uL   Neutrophils 55 %   Lymphs 35 %   Monocytes 8 %   Eos 2 %   Basos 0 %   Neutrophils Absolute 3.7 1.4 - 7.0 x10E3/uL   Lymphocytes Absolute 2.3 0.7 - 3.1 x10E3/uL   Monocytes Absolute 0.5 0.1 - 0.9 x10E3/uL   EOS (ABSOLUTE) 0.1 0.0 - 0.4 x10E3/uL    Basophils Absolute 0.0 0.0 - 0.2 x10E3/uL   Immature Granulocytes 0 %   Immature Grans (Abs) 0.0 0.0 - 0.1 x10E3/uL  VITAMIN D 25 Hydroxy (Vit-D Deficiency, Fractures)  Result Value Ref Range   Vit D, 25-Hydroxy 13.7 (L) 30.0 - 100.0 ng/mL      Assessment & Plan:   Problem List Items Addressed This Visit      Cardiovascular and Mediastinum   Migraine without aura    Improved on the medicine; will have him see neurology for the daytime somnolence and would appreciate any additional insight they can give for his migraines      Relevant Medications   escitalopram (LEXAPRO) 10 MG tablet     Endocrine   Goiter    Normal thyroid tests; I think this may be just his appearance, body habitus, obesity; very symmetric, no nodules, will follow        Other   ADHD (attention deficit hyperactivity disorder)    Patient has not actually started the medicine yet, he tells me so I can't determine its effectiveness      Depression, major, single episode, moderate (HCC)    Increase the escitalopram to 15 mg daily; continue the vit D supplementation; close f/u; reasons to call or seek immediate care reviewed, including dark thoughts, thoughts of harming self or others, etc.      Relevant Medications   escitalopram (LEXAPRO) 10 MG tablet   Vitamin D deficiency    Continue supplementation, as low D could exacerbate fatigue, depression      Somnolence, daytime - Primary    Patient could have sleep apnea; will have neurology see him; I do not think this is primary a respiratory issue and he has already had a tonsillectomy, so I would rather neuro see him to assess for possible OSA instead of ENT      Relevant Orders   Ambulatory referral to Neurology      Follow up plan: Return in about 3 weeks (around 07/25/2015).  Meds ordered this encounter  Medications  . escitalopram (LEXAPRO) 10 MG tablet    Sig: Take 1.5 tablets (15 mg total) by mouth daily.    Dispense:  45 tablet    Refill:   0    Pharmacist -- we're increasing the dose   Orders Placed This Encounter  Procedures  . Ambulatory referral to Neurology   An after-visit summary was printed and given to the patient at check-out.  Please see the patient instructions which may contain other information and recommendations beyond what is mentioned above in  the assessment and plan.

## 2015-07-04 NOTE — Patient Instructions (Addendum)
We'll have you see the neurologist to evaluate your fatigue and look for possibly sleep problem Increase escitalopram from 10 mg daily to 15 mg daily Watch for any worsening in the tremor and call me if noted If you have dark thoughts or thoughts of self-harm, seek medical help immediately by calling 911 If you have tons of energy or lack of need for sleep, then call me right away Do not drive when tired Continue the vitamin D Do call to schedule an appointment with one of the therapists and start working with them If you decide you would like to see a psychiatrist, then please do call and we'll make that appointment for you There is a place called [facility] and you can contact them or walk-in to see them if needed Return in 3 weeks

## 2015-07-09 DIAGNOSIS — R400 Somnolence: Secondary | ICD-10-CM | POA: Insufficient documentation

## 2015-07-09 NOTE — Assessment & Plan Note (Signed)
Continue supplementation, as low D could exacerbate fatigue, depression

## 2015-07-09 NOTE — Assessment & Plan Note (Signed)
Patient could have sleep apnea; will have neurology see him; I do not think this is primary a respiratory issue and he has already had a tonsillectomy, so I would rather neuro see him to assess for possible OSA instead of ENT

## 2015-07-09 NOTE — Assessment & Plan Note (Signed)
Increase the escitalopram to 15 mg daily; continue the vit D supplementation; close f/u; reasons to call or seek immediate care reviewed, including dark thoughts, thoughts of harming self or others, etc.

## 2015-07-09 NOTE — Assessment & Plan Note (Signed)
Normal thyroid tests; I think this may be just his appearance, body habitus, obesity; very symmetric, no nodules, will follow

## 2015-07-09 NOTE — Assessment & Plan Note (Signed)
Improved on the medicine; will have him see neurology for the daytime somnolence and would appreciate any additional insight they can give for his migraines

## 2015-07-09 NOTE — Assessment & Plan Note (Signed)
Patient has not actually started the medicine yet, he tells me so I can't determine its effectiveness

## 2015-07-16 ENCOUNTER — Encounter: Payer: Self-pay | Admitting: Neurology

## 2015-07-16 ENCOUNTER — Institutional Professional Consult (permissible substitution): Payer: Self-pay | Admitting: Neurology

## 2015-07-16 ENCOUNTER — Telehealth: Payer: Self-pay

## 2015-07-16 NOTE — Telephone Encounter (Signed)
Patient did not show to new sleep consult appt.  

## 2015-07-24 ENCOUNTER — Ambulatory Visit (INDEPENDENT_AMBULATORY_CARE_PROVIDER_SITE_OTHER): Payer: Managed Care, Other (non HMO) | Admitting: Family Medicine

## 2015-07-24 ENCOUNTER — Encounter: Payer: Self-pay | Admitting: Family Medicine

## 2015-07-24 VITALS — BP 112/73 | HR 55 | Temp 97.1°F | Wt 248.0 lb

## 2015-07-24 DIAGNOSIS — F902 Attention-deficit hyperactivity disorder, combined type: Secondary | ICD-10-CM | POA: Diagnosis not present

## 2015-07-24 DIAGNOSIS — G43009 Migraine without aura, not intractable, without status migrainosus: Secondary | ICD-10-CM

## 2015-07-24 DIAGNOSIS — E559 Vitamin D deficiency, unspecified: Secondary | ICD-10-CM

## 2015-07-24 DIAGNOSIS — F321 Major depressive disorder, single episode, moderate: Secondary | ICD-10-CM

## 2015-07-24 DIAGNOSIS — G4710 Hypersomnia, unspecified: Secondary | ICD-10-CM

## 2015-07-24 DIAGNOSIS — R400 Somnolence: Secondary | ICD-10-CM

## 2015-07-24 MED ORDER — ESCITALOPRAM OXALATE 20 MG PO TABS: 20.0000 mg | ORAL_TABLET | Freq: Every day | ORAL | Status: DC

## 2015-07-24 MED ORDER — NORTRIPTYLINE HCL 10 MG PO CAPS: 20.0000 mg | ORAL_CAPSULE | Freq: Every day | ORAL | Status: DC

## 2015-07-24 MED ORDER — TRAZODONE HCL 100 MG PO TABS: 50.0000 mg | ORAL_TABLET | Freq: Every evening | ORAL | Status: DC | PRN

## 2015-07-24 NOTE — Assessment & Plan Note (Addendum)
Refer to psychiatrist who can hopefully manage both his depression and ADHD

## 2015-07-24 NOTE — Assessment & Plan Note (Addendum)
Discussed with patient; not to remission yet; refer to psychiatrist; increase to 20 mg escitalopram; no concern at this time for bipolar d/o, but reasons to seek immediate help/medical care reviewed with patient

## 2015-07-24 NOTE — Progress Notes (Signed)
BP 112/73 mmHg  Pulse 55  Temp(Src) 97.1 F (36.2 C)  Wt 248 lb (112.492 kg)  SpO2 97%   Subjective:    Patient ID: Peter Petersen, male    DOB: 11/19/1995, 19 y.o.   MRN: [id number]  HPI: Peter Petersen is a 19 y.o. male  Chief Complaint  Patient presents with  . Depression    follow up, increased his Escitalopram 15mg   . Medication Refill    He needs a refill on the Trazadone   [facility]24[facility] 06/13/2015 06/13/2015  Decreased Interest 2 2 2   Down, Depressed, Hopeless 1 1 1   PHQ - 2 Score 3 3 3   Altered sleeping 3 3 -  Tired, decreased energy 3 3 -  Change in appetite 3 3 -  Feeling bad or failure about yourself  1 2 -  Trouble concentrating 3 3 -  Moving slowly or fidgety/restless 0 2 -  Suicidal thoughts 0 0 -  PHQ-9 Score 16 19 -  Difficult doing work/chores Somewhat difficult - -   He feels like we're making some headway; he is making a little headway, but not as much as he would like to see; his anxiety will stay at bay for longer, but it still hits him; fine to be out in public; prefers to be at home, introverted usually; cappucino machine went down at work and that was stressful; he kept retreating to the back so they let him go home early, after he was called in early; they really needed someone now so he came in early and everything was on fire figuratively; it was stresssful, but he was with friends so it was better and was helpful; he doesn't really consider [employer] "work" 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  He has been called into work just about every single day because of the busy shopping/holiday season, so not able to get in to see counselor; he does still have the list and has the numbers; he thinks getting called in urgently will slow down and he will be able to get into counseling; he is not interested in seeing psychiatrist  Taking migraine medicine, notriptyline, but got a migraine around 8 pm; decresaed the number of migraines;  first in a while; no excessive dry mouth; no weight gain; not sure of any particular triggers; stressful situation set it off yesterday  Relevant past medical, surgical, family and social history reviewed and updated as indicated. Interim medical history since our last visit reviewed. Allergies and medications reviewed and updated.  Review of Systems Per HPI unless specifically indicated above     Objective:    BP 112/73 mmHg  Pulse 55  Temp(Src) 97.1 F (36.2 C)  Wt 248 lb (112.492 kg)  SpO2 97%  Wt Readings from Last 3 Encounters:  07/24/15 248 lb (112.492 kg) (99 %*, Z = 2.38)  07/04/15 245 lb (111.131 kg) (99 %*, Z = 2.33)  06/13/15 246 lb (111.585 kg) (99 %*, Z = 2.35)   * Growth percentiles are based on CDC 2-20 Years data.    Physical Exam  Constitutional: He appears well-developed and well-nourished. No distress.  Eyes: No scleral icterus.  Cardiovascular: Regular rhythm.  Bradycardia present.   Pulmonary/Chest: Effort normal.  Neurological: He is alert.  Skin: No pallor.  Psychiatric: His affect is blunt.  Affect somewhat blunted; fair to good eye contact, more conversational today, discussed sci-fi interests, issues at work   June 13, 2015: Vitamin D level 13.7  Assessment & Plan:   Problem List Items Addressed This Visit      Cardiovascular and Mediastinum   Migraine without aura    Increase TCA to 20 mg at bedtime; patient was referred to neurology Dec 7th, and we would appreciate any additional insight he/she can give regarding patient's migraines      Relevant Medications   traZODone (DESYREL) 100 MG tablet   escitalopram (LEXAPRO) 20 MG tablet   nortriptyline (PAMELOR) 10 MG capsule     Other   ADHD (attention deficit hyperactivity disorder)    Refer to psychiatrist who can hopefully manage both his depression and ADHD      Relevant Orders   Ambulatory referral to Psychiatry   Depression, major, single episode, moderate (HCC) - Primary     Discussed with patient; not to remission yet; refer to psychiatrist; increase to 20 mg escitalopram; no concern at this time for bipolar d/o, but reasons to seek immediate help/medical care reviewed with patient      Relevant Medications   traZODone (DESYREL) 100 MG tablet   escitalopram (LEXAPRO) 20 MG tablet   nortriptyline (PAMELOR) 10 MG capsule   Other Relevant Orders   Ambulatory referral to Psychiatry   Vitamin D deficiency    Continue supplementation; likely affecting his mood and energy level, but not sole cause; recheck in a few months at f/u      Somnolence, daytime    Patient was referred to neurologist December 7th         Follow up plan: No Follow-up on file.  An after-visit summary was printed and given to the patient at check-out.  Please see the patient instructions which may contain other information and recommendations beyond what is mentioned above in the assessment and plan. Meds ordered this encounter  Medications  . traZODone (DESYREL) 100 MG tablet    Sig: Take 0.5-1 tablets (50-100 mg total) by mouth at bedtime as needed for sleep.    Dispense:  30 tablet    Refill:  2  . escitalopram (LEXAPRO) 20 MG tablet    Sig: Take 1 tablet (20 mg total) by mouth daily.    Dispense:  30 tablet    Refill:  0    Pharmacist - we're increasing the dose  . nortriptyline (PAMELOR) 10 MG capsule    Sig: Take 2 capsules (20 mg total) by mouth at bedtime.    Dispense:  60 capsule    Refill:  2   Face-to-face time with patient was more than 25 minutes, >50% time spent counseling and coordination of care

## 2015-07-24 NOTE — Patient Instructions (Addendum)
We'll have you see the psychiatrist Increase the escitalopram to 20 mg daily Call right away for dark thoughts or extreme elevation of mood Do get in to see the counselor Return to see me in four weeks if you've not already seen the psychiatrist Increase the nortriptyline from 10 mg at night to 20 mg at night for headache prevention

## 2015-07-28 NOTE — Assessment & Plan Note (Signed)
Continue supplementation; likely affecting his mood and energy level, but not sole cause; recheck in a few months at f/u

## 2015-07-28 NOTE — Assessment & Plan Note (Addendum)
Increase TCA to 20 mg at bedtime; patient was referred to neurology Dec 7th, and we would appreciate any additional insight he/she can give regarding patient's migraines

## 2015-07-28 NOTE — Assessment & Plan Note (Signed)
Patient was referred to neurologist December 7th

## 2015-09-26 ENCOUNTER — Encounter: Payer: Self-pay | Admitting: Family Medicine

## 2015-09-26 ENCOUNTER — Ambulatory Visit (INDEPENDENT_AMBULATORY_CARE_PROVIDER_SITE_OTHER): Payer: Managed Care, Other (non HMO) | Admitting: Unknown Physician Specialty

## 2015-09-26 ENCOUNTER — Encounter: Payer: Self-pay | Admitting: Unknown Physician Specialty

## 2015-09-26 VITALS — BP 120/81 | HR 93 | Temp 99.0°F | Ht 65.7 in | Wt 251.4 lb

## 2015-09-26 DIAGNOSIS — J069 Acute upper respiratory infection, unspecified: Secondary | ICD-10-CM

## 2015-09-26 NOTE — Patient Instructions (Addendum)
Viral Infections °A viral infection can be caused by different types of viruses. Most viral infections are not serious and resolve on their own. However, some infections may cause severe symptoms and may lead to further complications. °SYMPTOMS °Viruses can frequently cause: °· Minor sore throat. °· Aches and pains. °· Headaches. °· Runny nose. °· Different types of rashes. °· Watery eyes. °· Tiredness. °· Cough. °· Loss of appetite. °· Gastrointestinal infections, resulting in nausea, vomiting, and diarrhea. °These symptoms do not respond to antibiotics because the infection is not caused by bacteria. However, you might catch a bacterial infection following the viral infection. This is sometimes called a "superinfection." Symptoms of such a bacterial infection may include: °· Worsening sore throat with pus and difficulty swallowing. °· Swollen neck glands. °· Chills and a high or persistent fever. °· Severe headache. °· Tenderness over the sinuses. °· Persistent overall ill feeling (malaise), muscle aches, and tiredness (fatigue). °· Persistent cough. °· Yellow, green, or brown mucus production with coughing. °HOME CARE INSTRUCTIONS  °· Only take over-the-counter or prescription medicines for pain, discomfort, diarrhea, or fever as directed by your caregiver. °· Drink enough water and fluids to keep your urine clear or pale yellow. Sports drinks can provide valuable electrolytes, sugars, and hydration. °· Get plenty of rest and maintain proper nutrition. Soups and broths with crackers or rice are fine. °SEEK IMMEDIATE MEDICAL CARE IF:  °· You have severe headaches, shortness of breath, chest pain, neck pain, or an unusual rash. °· You have uncontrolled vomiting, diarrhea, or you are unable to keep down fluids. °· You or your child has an oral temperature above 102° F (38.9° C), not controlled by medicine. °· Your baby is older than 3 months with a rectal temperature of 102° F (38.9° C) or higher. °· Your baby is 3  months old or younger with a rectal temperature of 100.4° F (38° C) or higher. °MAKE SURE YOU:  °· Understand these instructions. °· Will watch your condition. °· Will get help right away if you are not doing well or get worse. °  °This information is not intended to replace advice given to you by your health care provider. Make sure you discuss any questions you have with your health care provider. °  °Document Released: 04/23/2005 Document Revised: 10/06/2011 Document Reviewed: 12/20/2014 °Elsevier Interactive Patient Education ©2016 [employer] ° °

## 2015-09-26 NOTE — Progress Notes (Signed)
BP 120/81 mmHg  Pulse 93  Temp(Src) 99 F (37.2 C)  Ht 5' 5.7" (1.669 m)  Wt 251 lb 6.4 oz (114.034 kg)  BMI 40.94 kg/m2  SpO2 95%   Subjective:    Patient ID: Peter Petersen, male    DOB: 10/28/1995, 20 y.o.   MRN: [id number]  HPI: Peter Petersen is a 20 y.o. male  Chief Complaint  Patient presents with  . Sore Throat    pt states he has had a sore throat, fever, runny nose, and heaches for about 2 days now.     Sore Throat  This is a new problem. The current episode started in the past 7 days. The problem has been gradually worsening. Neither side of throat is experiencing more pain than the other. There has been no fever. The fever has been present for less than 1 day. The pain is mild. Associated symptoms include congestion, coughing, headaches and neck pain. Pertinent negatives include no ear pain, swollen glands or trouble swallowing. Associated symptoms comments: Chills and body aches. He has had no exposure to strep or mono. Treatments tried: Tried Dayquil and Nyquil. The treatment provided mild relief.    Patient has not had flu shot this year.    Relevant past medical, surgical, family and social history reviewed and updated as indicated. Interim medical history since our last visit reviewed. Allergies and medications reviewed and updated.  Review of Systems  HENT: Positive for congestion. Negative for ear pain and trouble swallowing.   Respiratory: Positive for cough.   Musculoskeletal: Positive for neck pain.  Neurological: Positive for headaches.    Per HPI unless specifically indicated above     Objective:    BP 120/81 mmHg  Pulse 93  Temp(Src) 99 F (37.2 C)  Ht 5' 5.7" (1.669 m)  Wt 251 lb 6.4 oz (114.034 kg)  BMI 40.94 kg/m2  SpO2 95%  Wt Readings from Last 3 Encounters:  09/26/15 251 lb 6.4 oz (114.034 kg) (99 %*, Z = 2.42)  07/24/15 248 lb (112.492 kg) (99 %*, Z = 2.38)  07/04/15 245 lb (111.131 kg) (99 %*, Z = 2.33)   * Growth percentiles  are based on CDC 2-20 Years data.    Physical Exam  Constitutional: He is oriented to person, place, and time. He appears well-developed and well-nourished. No distress.  HENT:  Head: Normocephalic and atraumatic.  Right Ear: Tympanic membrane and ear canal normal.  Left Ear: Tympanic membrane and ear canal normal.  Nose: Rhinorrhea present. Right sinus exhibits no maxillary sinus tenderness and no frontal sinus tenderness. Left sinus exhibits no maxillary sinus tenderness and no frontal sinus tenderness.  Mouth/Throat: Uvula is midline. Posterior oropharyngeal edema present.  Eyes: Conjunctivae and lids are normal. Right eye exhibits no discharge. Left eye exhibits no discharge. No scleral icterus.  Neck: Neck supple.  Cardiovascular: Normal rate, regular rhythm and normal heart sounds.   Pulmonary/Chest: Effort normal and breath sounds normal. No respiratory distress.  Abdominal: Normal appearance. There is no splenomegaly or hepatomegaly.  Musculoskeletal: Normal range of motion.  Neurological: He is alert and oriented to person, place, and time.  Skin: Skin is warm, dry and intact. No rash noted. No pallor.  Psychiatric: He has a normal mood and affect. His behavior is normal. Judgment and thought content normal.  Nursing note and vitals reviewed.   Results for orders placed or performed in visit on 06/13/15  GC/Chlamydia Probe Amp  Result Value Ref Range  Chlamydia trachomatis, NAA Negative Negative   Neisseria gonorrhoeae by PCR Negative Negative  Magnesium  Result Value Ref Range   Magnesium 2.1 1.6 - 2.3 mg/dL  PT and PTT  Result Value Ref Range   INR 1.0 0.8 - 1.2   Prothrombin Time 10.2 9.1 - 12.0 sec   aPTT 33 24 - 33 sec  HIV antibody  Result Value Ref Range   HIV Screen 4th Generation wRfx Non Reactive Non Reactive  CBC with Differential/Platelet  Result Value Ref Range   WBC 6.7 3.4 - 10.8 x10E3/uL   RBC 5.01 4.14 - 5.80 x10E6/uL   Hemoglobin 14.7 12.6 - 17.7  g/dL   Hematocrit 16.1 09.6 - 51.0 %   MCV 87 79 - 97 fL   MCH 29.3 26.6 - 33.0 pg   MCHC 33.6 31.5 - 35.7 g/dL   RDW 04.5 40.9 - 81.1 %   Platelets 281 150 - 379 x10E3/uL   Neutrophils 55 %   Lymphs 35 %   Monocytes 8 %   Eos 2 %   Basos 0 %   Neutrophils Absolute 3.7 1.4 - 7.0 x10E3/uL   Lymphocytes Absolute 2.3 0.7 - 3.1 x10E3/uL   Monocytes Absolute 0.5 0.1 - 0.9 x10E3/uL   EOS (ABSOLUTE) 0.1 0.0 - 0.4 x10E3/uL   Basophils Absolute 0.0 0.0 - 0.2 x10E3/uL   Immature Granulocytes 0 %   Immature Grans (Abs) 0.0 0.0 - 0.1 x10E3/uL  VITAMIN D 25 Hydroxy (Vit-D Deficiency, Fractures)  Result Value Ref Range   Vit D, 25-Hydroxy 13.7 (L) 30.0 - 100.0 ng/mL      Assessment & Plan:   Problem List Items Addressed This Visit    None    Visit Diagnoses    URI (upper respiratory infection)    -  Primary    Strep test was negative. Encouraged supportive care of rest, fluids, and salt water gargle.     Relevant Orders    Rapid strep screen (not at [facility])        Follow up plan: Return if symptoms worsen or fail to improve.

## 2015-09-29 LAB — CULTURE, GROUP A STREP: STREP A CULTURE: NEGATIVE

## 2015-09-29 LAB — RAPID STREP SCREEN (MED CTR MEBANE ONLY): Strep Gp A Ag, IA W/Reflex: NEGATIVE

## 2016-06-03 ENCOUNTER — Ambulatory Visit (INDEPENDENT_AMBULATORY_CARE_PROVIDER_SITE_OTHER): Payer: Managed Care, Other (non HMO) | Admitting: Family Medicine

## 2016-06-03 ENCOUNTER — Encounter: Payer: Self-pay | Admitting: Family Medicine

## 2016-06-03 VITALS — BP 117/75 | HR 55 | Temp 98.4°F | Ht 68.2 in | Wt 241.8 lb

## 2016-06-03 DIAGNOSIS — F5102 Adjustment insomnia: Secondary | ICD-10-CM

## 2016-06-03 DIAGNOSIS — F321 Major depressive disorder, single episode, moderate: Secondary | ICD-10-CM | POA: Diagnosis not present

## 2016-06-03 DIAGNOSIS — F902 Attention-deficit hyperactivity disorder, combined type: Secondary | ICD-10-CM

## 2016-06-03 MED ORDER — NORTRIPTYLINE HCL 10 MG PO CAPS: 20.0000 mg | ORAL_CAPSULE | Freq: Every day | ORAL | 2 refills | Status: DC

## 2016-06-03 MED ORDER — METHYLPHENIDATE HCL ER (CD) 20 MG PO CPCR: 20.0000 mg | ORAL_CAPSULE | ORAL | 0 refills | Status: DC

## 2016-06-03 MED ORDER — TRAZODONE HCL 100 MG PO TABS: 50.0000 mg | ORAL_TABLET | Freq: Every evening | ORAL | 0 refills | Status: DC | PRN

## 2016-06-03 MED ORDER — BUPROPION HCL ER (XL) 150 MG PO TB24: 150.0000 mg | ORAL_TABLET | Freq: Every day | ORAL | 0 refills | Status: DC

## 2016-06-03 NOTE — Patient Instructions (Signed)
Follow up in 1 month   
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
## 2016-07-08 ENCOUNTER — Ambulatory Visit: Payer: Managed Care, Other (non HMO) | Admitting: Unknown Physician Specialty

## 2017-06-11 ENCOUNTER — Other Ambulatory Visit: Payer: Self-pay

## 2017-06-11 ENCOUNTER — Encounter: Payer: Self-pay | Admitting: Emergency Medicine

## 2017-06-11 DIAGNOSIS — S161XXA Strain of muscle, fascia and tendon at neck level, initial encounter: Secondary | ICD-10-CM | POA: Diagnosis not present

## 2017-06-11 DIAGNOSIS — Y92410 Unspecified street and highway as the place of occurrence of the external cause: Secondary | ICD-10-CM | POA: Insufficient documentation

## 2017-06-11 DIAGNOSIS — Y999 Unspecified external cause status: Secondary | ICD-10-CM | POA: Diagnosis not present

## 2017-06-11 DIAGNOSIS — Z79899 Other long term (current) drug therapy: Secondary | ICD-10-CM | POA: Diagnosis not present

## 2017-06-11 DIAGNOSIS — Y9389 Activity, other specified: Secondary | ICD-10-CM | POA: Insufficient documentation

## 2017-06-11 DIAGNOSIS — S199XXA Unspecified injury of neck, initial encounter: Secondary | ICD-10-CM | POA: Diagnosis present
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
## 2017-06-12 ENCOUNTER — Emergency Department: Payer: Managed Care, Other (non HMO)

## 2017-06-12 ENCOUNTER — Emergency Department
Admission: EM | Admit: 2017-06-12 | Discharge: 2017-06-12 | Disposition: A | Discharge status: Home / Self Care | Payer: Managed Care, Other (non HMO) | Attending: Emergency Medicine | Admitting: Emergency Medicine

## 2017-06-12 DIAGNOSIS — S161XXA Strain of muscle, fascia and tendon at neck level, initial encounter: Secondary | ICD-10-CM | POA: Diagnosis not present

## 2017-06-12 MED ORDER — IBUPROFEN 800 MG PO TABS
800.0000 mg | ORAL_TABLET | Freq: Once | ORAL | Status: AC
  Administered 2017-06-12: 800 mg via ORAL
  Filled 2017-06-12: qty 1

## 2017-06-12 MED ORDER — CYCLOBENZAPRINE HCL 10 MG PO TABS
10.0000 mg | ORAL_TABLET | Freq: Once | ORAL | Status: AC
  Administered 2017-06-12: 10 mg via ORAL
  Filled 2017-06-12: qty 1

## 2017-06-12 MED ORDER — CYCLOBENZAPRINE HCL 10 MG PO TABS: 10.0000 mg | ORAL_TABLET | Freq: Three times a day (TID) | ORAL | 0 refills | Status: DC | PRN
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
## 2017-07-28 HISTORY — PX: DENTAL SURGERY: SHX609

## 2017-08-18 ENCOUNTER — Emergency Department: Payer: Managed Care, Other (non HMO)

## 2017-08-18 ENCOUNTER — Emergency Department
Admission: EM | Admit: 2017-08-18 | Discharge: 2017-08-18 | Disposition: A | Discharge status: Home / Self Care | Payer: Managed Care, Other (non HMO) | Attending: Emergency Medicine | Admitting: Emergency Medicine

## 2017-08-18 ENCOUNTER — Encounter: Payer: Self-pay | Admitting: Emergency Medicine

## 2017-08-18 DIAGNOSIS — S0101XA Laceration without foreign body of scalp, initial encounter: Secondary | ICD-10-CM | POA: Insufficient documentation

## 2017-08-18 DIAGNOSIS — F909 Attention-deficit hyperactivity disorder, unspecified type: Secondary | ICD-10-CM | POA: Insufficient documentation

## 2017-08-18 DIAGNOSIS — W228XXA Striking against or struck by other objects, initial encounter: Secondary | ICD-10-CM | POA: Diagnosis not present

## 2017-08-18 DIAGNOSIS — Z79899 Other long term (current) drug therapy: Secondary | ICD-10-CM | POA: Insufficient documentation

## 2017-08-18 DIAGNOSIS — Z23 Encounter for immunization: Secondary | ICD-10-CM | POA: Diagnosis not present

## 2017-08-18 DIAGNOSIS — S0990XA Unspecified injury of head, initial encounter: Secondary | ICD-10-CM

## 2017-08-18 DIAGNOSIS — Y929 Unspecified place or not applicable: Secondary | ICD-10-CM | POA: Diagnosis not present

## 2017-08-18 DIAGNOSIS — Y939 Activity, unspecified: Secondary | ICD-10-CM | POA: Diagnosis not present

## 2017-08-18 DIAGNOSIS — Y999 Unspecified external cause status: Secondary | ICD-10-CM | POA: Diagnosis not present

## 2017-08-18 MED ORDER — TETANUS-DIPHTH-ACELL PERTUSSIS 5-2.5-18.5 LF-MCG/0.5 IM SUSP
0.5000 mL | Freq: Once | INTRAMUSCULAR | Status: AC
  Administered 2017-08-18: 0.5 mL via INTRAMUSCULAR
  Filled 2017-08-18: qty 0.5

## 2017-08-18 MED ORDER — LIDOCAINE HCL (PF) 1 % IJ SOLN
INTRAMUSCULAR | Status: AC
  Filled 2017-08-18: qty 5

## 2017-08-18 NOTE — ED Provider Notes (Signed)
[facility] Emergency Department Provider Note  ____________________________________________   I have reviewed the triage vital signs and the nursing notes. Where available I have reviewed prior notes and, if possible and indicated, outside hospital notes.    HISTORY  Chief Complaint Fall; Head Injury; and Laceration    HPI Peter Petersen is a 22 y.o. male who presents today complaining of a laceration to his scalp.  Happened several hours ago at work.  He walked into a overhead truck part apparently and hit his head pretty hard.  He stated in triage that he passed out after impact however, when I talk to him he states he was just woozy and did not actually fall or hit the ground.  In any event, he did receive a CT scan after complaining of loss of consciousness and that is negative.  Patient states he may have been unconscious for "a second or 2".  Patient had no syncopal symptoms.  His tetanus is not up-to-date to his knowledge, he denies severe headache nausea or vomiting, he has no concussion symptoms at this time.     Past Medical History:  Diagnosis Date  . ADHD (attention deficit hyperactivity disorder)     Patient Active Problem List   Diagnosis Date Noted  . Somnolence, daytime 07/09/2015  . Vitamin D deficiency 06/19/2015  . Preventative health care 06/13/2015  . Blepharospasm 06/13/2015  . Screening for HIV without presence of risk factors 06/13/2015  . Screen for STD (sexually transmitted disease) 06/13/2015  . Depression, major, single episode, moderate (HCC) 06/13/2015  . Low HDL (under 40) 05/26/2015  . Migraine without aura 05/25/2015  . ADHD (attention deficit hyperactivity disorder) 05/25/2015  . Insomnia 05/25/2015  . Obesity (BMI 35.0-39.9 without comorbidity) 05/25/2015  . Goiter 05/25/2015    Past Surgical History:  Procedure Laterality Date  . TONSILLECTOMY      Prior to Admission medications   Medication Sig Start Date  End Date Taking? Authorizing Provider  buPROPion (WELLBUTRIN XL) 150 MG 24 hr tablet Take 1 tablet (150 mg total) by mouth daily. Take 1 tablet daily for 2 weeks, then twice daily from then on 06/03/16   Particia Nearing, PA-C  cyclobenzaprine (FLEXERIL) 10 MG tablet Take 1 tablet (10 mg total) 3 (three) times daily as needed by mouth. 06/12/17   Darci Current, MD  methylphenidate (METADATE CD) 20 MG CR capsule Take 1 capsule (20 mg total) by mouth every morning. 06/03/16   Particia Nearing, PA-C  nortriptyline (PAMELOR) 10 MG capsule Take 2 capsules (20 mg total) by mouth at bedtime. 06/03/16   Particia Nearing, PA-C  traZODone (DESYREL) 100 MG tablet Take 0.5-1 tablets (50-100 mg total) by mouth at bedtime as needed for sleep. 06/03/16   Particia Nearing, PA-C    Allergies Patient has no known allergies.  Family History  Problem Relation Age of Onset  . Heart disease Maternal Grandfather   . Cancer Neg Hx   . Diabetes Neg Hx   . Hypertension Neg Hx   . Stroke Neg Hx     Social History Social History   Tobacco Use  . Smoking status: Never Smoker  . Smokeless tobacco: Never Used  Substance Use Topics  . Alcohol use: No  . Drug use: No    Review of Systems Constitutional: No fever/chills Eyes: No visual changes. ENT: No sore throat. No stiff neck no neck pain Cardiovascular: Denies chest pain. Respiratory: Denies shortness of breath. Gastrointestinal:  no vomiting.  No diarrhea.  No constipation. Genitourinary: Negative for dysuria. Musculoskeletal: Negative lower extremity swelling Skin: Negative for rash. Neurological: Negative for severe headaches, focal weakness or numbness.   ____________________________________________   PHYSICAL EXAM:  VITAL SIGNS: ED Triage Vitals  Enc Vitals Group     BP 08/18/17 1907 (!) 141/64     Pulse Rate 08/18/17 1907 62     Resp 08/18/17 1907 18     Temp 08/18/17 1907 98.4 F (36.9 C)     Temp Source  08/18/17 1907 Oral     SpO2 08/18/17 1907 100 %     Weight 08/18/17 1907 225 lb (102.1 kg)     Height 08/18/17 1907 5\' 6"  (1.676 m)     Head Circumference --      Peak Flow --      Pain Score 08/18/17 1953 1     Pain Loc --      Pain Edu? --      Excl. in GC? --     Constitutional: Alert and oriented. Well appearing and in no acute distress. Eyes: Conjunctivae are normal Head: There is a laceration to the right front just inside the hairline, it is Y-shaped and approximately 2x 2 x 1 cm d depressed skull fracture or galeal injury noted HEENT: No congestion/rhinnorhea. Mucous membranes are moist.  Oropharynx non-erythematous Neck:   Nontender with no meningismus, no masses, no stridor Cardiovascular: Normal rate, regular rhythm. Grossly normal heart sounds.  Good peripheral circulation. Respiratory: Normal respiratory effort.  No retractions. Lungs CTAB. Abdominal: Soft and nontender. No distention. No guarding no rebound Back:  There is no focal tenderness or step off.  there is no midline tenderness there are no lesions noted. there is no CVA tenderness Musculoskeletal: No lower extremity tenderness, no upper extremity tenderness. No joint effusions, no DVT signs strong distal pulses no edema Neurologic:  Normal speech and language. No gross focal neurologic deficits are appreciated.  Skin:  Skin is warm, dry and intact. No rash noted. Psychiatric: Mood and affect are normal. Speech and behavior are normal.  ____________________________________________   LABS (all labs ordered are listed, but only abnormal results are displayed)  Labs Reviewed - No data to display  Pertinent labs  results that were available during my care of the patient were reviewed by me and considered in my medical decision making (see chart for details). ____________________________________________  EKG  I personally interpreted any EKGs ordered by me or  triage  ____________________________________________  RADIOLOGY  Pertinent labs & imaging results that were available during my care of the patient were reviewed by me and considered in my medical decision making (see chart for details). If possible, patient and/or family made aware of any abnormal findings.  Ct Head Wo Contrast  Result Date: 08/18/2017 CLINICAL DATA:  Headache.  Status post trip and fall. EXAM: CT HEAD WITHOUT CONTRAST TECHNIQUE: Contiguous axial images were obtained from the base of the skull through the vertex without intravenous contrast. COMPARISON:  None. FINDINGS: Brain: No evidence of acute infarction, hemorrhage, hydrocephalus, extra-axial collection or mass lesion/mass effect. Vascular: No hyperdense vessel or unexpected calcification. Skull: The mastoid air cells and paranasal sinuses are clear. The calvarium is intact. Sinuses/Orbits: No acute finding. Other: None. IMPRESSION: 1. Normal brain.  No acute intracranial abnormalities. Electronically Signed   By: Signa Kellaylor  Stroud M.D.   On: 08/18/2017 19:51   ____________________________________________    PROCEDURES  Procedure(s) performed: LACERATION REPAIR Performed by: Jeanmarie PlantJAMES A MCSHANE Authorized  by: JAMES A MCSHANE Consent: Verbal consent obtained. Risks and benefits: risks, benefits and alternatives were discussed Consent given by: patient Patient identity confirmed: provided demographic data Prepped and Draped in normal sterile fashion Wound explored  Laceration Location: Scalp  Laceration Length: Y shaped 2 x 2 x 1 cm  No Foreign Bodies seen or palpated  Anesthesia: local infiltration  Local anesthetic: pt  declined, risk benefits and alternatives of numbing medicine and no numbing medicine understood  Anesthetic total: N/A ml  Irrigation method: syringe Amount of cleaning: standard  Skin closure: Staples  Number of sutures: 4 staples  Technique: Staples  Patient tolerance: Patient  tolerated the procedure well with no immediate complications.   Procedures  Critical Care performed: None  ____________________________________________   INITIAL IMPRESSION / ASSESSMENT AND PLAN / ED COURSE  Pertinent labs & imaging results that were available during my care of the patient were reviewed by me and considered in my medical decision making (see chart for details).  Patient here after hitting his head CT is negative, no evidence of significant concussion but I have given him concussion precautions.  Patient and I had a long talk about whether he wanted lidocaine or just staples and he elected to just take staples.  He states he did not want the numbing medicine as "that hurts just as much as the staples".  We therefore did stapled with 4 staples is very well closed after irrigation and cleaning and closure he has no evidence of other injury or pathology this was a non-syncopal event and we will discharge him    ____________________________________________   FINAL CLINICAL IMPRESSION(S) / ED DIAGNOSES  Final diagnoses:  None      This chart was dictated using voice recognition software.  Despite best efforts to proofread,  errors can occur which can change meaning.     Jeanmarie Plant, MD 08/18/17 2128

## 2017-08-18 NOTE — ED Triage Notes (Signed)
Patient states that about noon today he tripped and fell hitting his head. Patient with laceration to head with bleeding controlled. Patient states that he blacked out for a few seconds. Patient denies taking any blood thinners.

## 2017-10-20 ENCOUNTER — Ambulatory Visit: Payer: Managed Care, Other (non HMO) | Admitting: Family Medicine

## 2018-02-10 ENCOUNTER — Ambulatory Visit (INDEPENDENT_AMBULATORY_CARE_PROVIDER_SITE_OTHER): Payer: Managed Care, Other (non HMO) | Admitting: Family Medicine

## 2018-02-10 ENCOUNTER — Encounter: Payer: Self-pay | Admitting: Family Medicine

## 2018-02-10 VITALS — BP 113/65 | HR 59 | Temp 98.6°F | Ht 66.5 in | Wt 236.3 lb

## 2018-02-10 DIAGNOSIS — M25531 Pain in right wrist: Secondary | ICD-10-CM | POA: Diagnosis not present

## 2018-02-10 DIAGNOSIS — G43009 Migraine without aura, not intractable, without status migrainosus: Secondary | ICD-10-CM | POA: Diagnosis not present

## 2018-02-10 DIAGNOSIS — F321 Major depressive disorder, single episode, moderate: Secondary | ICD-10-CM

## 2018-02-10 MED ORDER — PREDNISONE 10 MG PO TABS: 10.0000 mg | ORAL_TABLET | Freq: Every day | ORAL | 0 refills | Status: DC

## 2018-02-10 MED ORDER — SUMATRIPTAN SUCCINATE 50 MG PO TABS: 50.0000 mg | ORAL_TABLET | ORAL | 3 refills | Status: DC | PRN

## 2018-02-10 MED ORDER — BUPROPION HCL ER (XL) 150 MG PO TB24: 150.0000 mg | ORAL_TABLET | Freq: Every day | ORAL | 1 refills | Status: DC

## 2018-02-10 NOTE — Progress Notes (Signed)
   BP 113/65 (BP Location: Right Arm, Patient Position: Sitting, Cuff Size: Normal)   Pulse (!) 59   Temp 98.6 F (37 C) (Oral)   Ht 5' 6.5" (1.689 m)   Wt 236 lb 4.8 oz (107.2 kg)   SpO2 97%   BMI 37.57 kg/m    Subjective:    Patient ID: Peter Petersen A Axtman, male    DOB: 05/28/1996, 22 y.o.   MRN: [id number]  HPI: Peter CrutchRonald A Blakely is a 22 y.o. male  Chief Complaint  Patient presents with  . Wrist Pain    Right wrist. Ongoing for ~ 2 months. Feels like spreading to hand. Tried ice, ellivation and brace with no relief.   . Medication Refill    Patient would like to know if he could get some of his medications refilled.   Brace, icing, otc pain relievers   Migraines are worsening. Had windows tinted to help with them but police officer told him he would need a doctor's note to keep them tinted. Getting them several times weekly. Doesn't want to take a daily medication, used to be on nortryptiline. Sometimes excedrin migraine will help.   Feeling very anxious, getting back into school and working a lot. States his depression is under decent control but the anxiety is starting to cause those thoughts to creep back in.   Relevant past medical, surgical, family and social history reviewed and updated as indicated. Interim medical history since our last visit reviewed. Allergies and medications reviewed and updated.  Review of Systems  Per HPI unless specifically indicated above     Objective:    BP 113/65 (BP Location: Right Arm, Patient Position: Sitting, Cuff Size: Normal)   Pulse (!) 59   Temp 98.6 F (37 C) (Oral)   Ht 5' 6.5" (1.689 m)   Wt 236 lb 4.8 oz (107.2 kg)   SpO2 97%   BMI 37.57 kg/m   Wt Readings from Last 3 Encounters:  02/10/18 236 lb 4.8 oz (107.2 kg)  08/18/17 225 lb (102.1 kg)  06/11/17 222 lb (100.7 kg)    Physical Exam  Results for orders placed or performed in visit on 09/26/15  Rapid strep screen (not [facility])  Result Value Ref Range   Strep Gp A  Ag, IA W/Reflex Negative Negative  Culture, Group A Strep  Result Value Ref Range   Strep A Culture Negative       Assessment & Plan:   Problem List Items Addressed This Visit      Cardiovascular and Mediastinum   Migraine without aura - Primary   Relevant Medications   SUMAtriptan (IMITREX) 50 MG tablet   buPROPion (WELLBUTRIN XL) 150 MG 24 hr tablet     Other   Depression, major, single episode, moderate (HCC)   Relevant Medications   buPROPion (WELLBUTRIN XL) 150 MG 24 hr tablet    Other Visit Diagnoses    Right wrist pain       Relevant Orders   DG Wrist Complete Right (Completed)       Follow up plan: Return in about 6 weeks (around 03/24/2018) for migraine f/u, CPE.

## 2018-02-11 ENCOUNTER — Ambulatory Visit
Admission: RE | Admit: 2018-02-11 | Discharge: 2018-02-11 | Disposition: A | Discharge status: Home / Self Care | Payer: Managed Care, Other (non HMO) | Source: Ambulatory Visit | Attending: Family Medicine | Admitting: Family Medicine

## 2018-02-11 DIAGNOSIS — M25531 Pain in right wrist: Secondary | ICD-10-CM | POA: Insufficient documentation

## 2018-02-13 NOTE — Patient Instructions (Addendum)
Follow-up in 6 weeks

## 2018-03-19 ENCOUNTER — Other Ambulatory Visit: Payer: Self-pay

## 2018-03-19 MED ORDER — BUPROPION HCL ER (XL) 150 MG PO TB24: 150.0000 mg | ORAL_TABLET | Freq: Every day | ORAL | 0 refills | Status: DC

## 2018-03-19 NOTE — Telephone Encounter (Signed)
Sent!

## 2018-03-19 NOTE — Telephone Encounter (Signed)
Last visit: 02/10/2018 Next Visit: 04/05/2018  Refill for Bupropion XL 150 mg.  Just filled, next due 04/13/2018 and also asking for 90 day supply request.

## 2018-03-31 ENCOUNTER — Other Ambulatory Visit: Payer: Self-pay

## 2018-03-31 MED ORDER — BUPROPION HCL ER (XL) 150 MG PO TB24: 150.0000 mg | ORAL_TABLET | Freq: Every day | ORAL | 0 refills | Status: DC

## 2018-03-31 NOTE — Telephone Encounter (Signed)
90 day supply request for Bupropion 150mg . Last seen 03/13/2018.

## 2018-04-05 ENCOUNTER — Encounter: Payer: Managed Care, Other (non HMO) | Admitting: Family Medicine

## 2018-04-12 ENCOUNTER — Other Ambulatory Visit: Payer: Self-pay

## 2018-04-12 ENCOUNTER — Encounter: Payer: Self-pay | Admitting: Family Medicine

## 2018-04-12 ENCOUNTER — Ambulatory Visit (INDEPENDENT_AMBULATORY_CARE_PROVIDER_SITE_OTHER): Payer: Managed Care, Other (non HMO) | Admitting: Family Medicine

## 2018-04-12 VITALS — BP 123/70 | HR 56 | Temp 98.1°F | Ht 66.2 in | Wt 236.0 lb

## 2018-04-12 DIAGNOSIS — Z23 Encounter for immunization: Secondary | ICD-10-CM | POA: Diagnosis not present

## 2018-04-12 DIAGNOSIS — F321 Major depressive disorder, single episode, moderate: Secondary | ICD-10-CM

## 2018-04-12 DIAGNOSIS — G43009 Migraine without aura, not intractable, without status migrainosus: Secondary | ICD-10-CM

## 2018-04-12 DIAGNOSIS — M25531 Pain in right wrist: Secondary | ICD-10-CM

## 2018-04-12 DIAGNOSIS — Z0000 Encounter for general adult medical examination without abnormal findings: Secondary | ICD-10-CM | POA: Diagnosis not present

## 2018-04-12 LAB — UA/M W/RFLX CULTURE, ROUTINE
Bilirubin, UA: NEGATIVE
Glucose, UA: NEGATIVE
Ketones, UA: NEGATIVE
LEUKOCYTES UA: NEGATIVE
NITRITE UA: NEGATIVE
PH UA: 7.5 (ref 5.0–7.5)
Protein, UA: NEGATIVE
RBC, UA: NEGATIVE
Specific Gravity, UA: 1.02 (ref 1.005–1.030)
Urobilinogen, Ur: 0.2 mg/dL (ref 0.2–1.0)

## 2018-04-12 MED ORDER — DULOXETINE HCL 20 MG PO CPEP: 20.0000 mg | ORAL_CAPSULE | Freq: Every day | ORAL | 1 refills | Status: DC

## 2018-04-12 NOTE — Progress Notes (Signed)
BP 123/70   Pulse (!) 56   Temp 98.1 F (36.7 C) (Oral)   Ht 5' 6.2" (1.681 m)   Wt 236 lb (107 kg)   SpO2 97%   BMI 37.86 kg/m    Subjective:    Patient ID: Peter Petersen, male    DOB: 1996/06/16, 22 y.o.   MRN: [id number]  HPI: Peter Petersen is a 22 y.o. male presenting on 04/12/2018 for comprehensive medical examination. Current medical complaints include:see below  Still having 3-4 migraines per month. Imitrex helping when he takes it but wanting something stronger possibly. Still taking prn OTC pain relievers. No confusion, extremity numbness or weakness, visual changes.   Still having persistent right wrist pain despite course of prednisone and a negative x-ray. Using brace while he works and sleeps. No redness, edema, weakness, swelling.   Does not feel the wellbutrin is helping very much for his down moods. No side effects, SI/HI.   He currently lives with: Interim Problems from his last visit: yes  Depression Screen done today and results listed below:  Depression screen [facility] 2/9 04/12/2018 02/10/2018 06/03/2016 07/24/2015 06/13/2015  Decreased Interest 2 2 2 2 2   Down, Depressed, Hopeless 3 2 2 1 1   PHQ - 2 Score 5 4 4 3 3   Altered sleeping 2 1 3 3 3   Tired, decreased energy 2 2 2 3 3   Change in appetite 3 2 3 3 3   Feeling bad or failure about yourself  2 2 3 1 2   Trouble concentrating 2 3 3 3 3   Moving slowly or fidgety/restless 0 0 0 0 2  Suicidal thoughts 0 1 0 0 0  PHQ-9 Score 16 15 18 16 19   Difficult doing work/chores - - - Somewhat difficult -    The patient does not have a history of falls. I did not complete a risk assessment for falls. A plan of care for falls was not documented.   Past Medical History:  Past Medical History:  Diagnosis Date  . ADHD (attention deficit hyperactivity disorder)     Surgical History:  Past Surgical History:  Procedure Laterality Date  . TONSILLECTOMY      Medications:  Current Outpatient Medications on File  Prior to Visit  Medication Sig  . SUMAtriptan (IMITREX) 50 MG tablet Take 1 tablet (50 mg total) by mouth every 2 (two) hours as needed for migraine. May repeat in 2 hours if headache persists or recurs.   No current facility-administered medications on file prior to visit.     Allergies:  No Known Allergies  Social History:  Social History   Socioeconomic History  . Marital status: Single    Spouse name: Not on file  . Number of children: Not on file  . Years of education: Not on file  . Highest education level: Not on file  Occupational History  . Not on file  Social Needs  . Financial resource strain: Not on file  . Food insecurity:    Worry: Not on file    Inability: Not on file  . Transportation needs:    Medical: Not on file    Non-medical: Not on file  Tobacco Use  . Smoking status: Never Smoker  . Smokeless tobacco: Never Used  Substance and Sexual Activity  . Alcohol use: No  . Drug use: No  . Sexual activity: Not on file  Lifestyle  . Physical activity:    Days per week: Not on file  Minutes per session: Not on file  . Stress: Not on file  Relationships  . Social connections:    Talks on phone: Not on file    Gets together: Not on file    Attends religious service: Not on file    Active member of club or organization: Not on file    Attends meetings of clubs or organizations: Not on file    Relationship status: Not on file  . Intimate partner violence:    Fear of current or ex partner: Not on file    Emotionally abused: Not on file    Physically abused: Not on file    Forced sexual activity: Not on file  Other Topics Concern  . Not on file  Social History Narrative  . Not on file   Social History   Tobacco Use  Smoking Status Never Smoker  Smokeless Tobacco Never Used   Social History   Substance and Sexual Activity  Alcohol Use No    Family History:  Family History  Problem Relation Age of Onset  . Heart disease Maternal  Grandfather   . Cancer Neg Hx   . Diabetes Neg Hx   . Hypertension Neg Hx   . Stroke Neg Hx     Past medical history, surgical history, medications, allergies, family history and social history reviewed with patient today and changes made to appropriate areas of the chart.   Review of Systems - General ROS: negative Psychological ROS: negative Ophthalmic ROS: negative ENT ROS: negative Allergy and Immunology ROS: negative Hematological and Lymphatic ROS: negative Endocrine ROS: negative Respiratory ROS: no cough, shortness of breath, or wheezing Cardiovascular ROS: no chest pain or dyspnea on exertion Gastrointestinal ROS: no abdominal pain, change in bowel habits, or black or bloody stools Genito-Urinary ROS: no dysuria, trouble voiding, or hematuria Musculoskeletal ROS: negative Neurological ROS: positive for - headaches Dermatological ROS: negative All other ROS negative except what is listed above and in the HPI.      Objective:    BP 123/70   Pulse (!) 56   Temp 98.1 F (36.7 C) (Oral)   Ht 5' 6.2" (1.681 m)   Wt 236 lb (107 kg)   SpO2 97%   BMI 37.86 kg/m   Wt Readings from Last 3 Encounters:  04/12/18 236 lb (107 kg)  02/10/18 236 lb 4.8 oz (107.2 kg)  08/18/17 225 lb (102.1 kg)    Physical Exam  Constitutional: He is oriented to person, place, and time. He appears well-developed and well-nourished. No distress.  HENT:  Head: Atraumatic.  Right Ear: External ear normal.  Left Ear: External ear normal.  Nose: Nose normal.  Mouth/Throat: Oropharynx is clear and moist.  Eyes: Pupils are equal, round, and reactive to light. Conjunctivae are normal. No scleral icterus.  Neck: Normal range of motion. Neck supple.  Cardiovascular: Normal rate, regular rhythm, normal heart sounds and intact distal pulses.  No murmur heard. Pulmonary/Chest: Effort normal and breath sounds normal. No respiratory distress.  Abdominal: Soft. Bowel sounds are normal. He exhibits no  distension and no mass. There is no tenderness. There is no guarding.  Genitourinary:  Genitourinary Comments: GU exam declined  Musculoskeletal: Normal range of motion. He exhibits no edema or tenderness.  Neurological: He is alert and oriented to person, place, and time. He has normal reflexes.  Skin: Skin is warm and dry. No rash noted.  Psychiatric: He has a normal mood and affect. His behavior is normal.  Nursing note and  vitals reviewed.   Results for orders placed or performed in visit on 04/12/18  CBC with Differential/Platelet  Result Value Ref Range   WBC 6.3 3.4 - 10.8 x10E3/uL   RBC 4.91 4.14 - 5.80 x10E6/uL   Hemoglobin 13.9 13.0 - 17.7 g/dL   Hematocrit 40.9 81.1 - 51.0 %   MCV 88 79 - 97 fL   MCH 28.3 26.6 - 33.0 pg   MCHC 32.2 31.5 - 35.7 g/dL   RDW 91.4 (L) 78.2 - 95.6 %   Platelets 292 150 - 450 x10E3/uL   Neutrophils 54 Not Estab. %   Lymphs 35 Not Estab. %   Monocytes 9 Not Estab. %   Eos 1 Not Estab. %   Basos 1 Not Estab. %   Neutrophils Absolute 3.5 1.4 - 7.0 x10E3/uL   Lymphocytes Absolute 2.2 0.7 - 3.1 x10E3/uL   Monocytes Absolute 0.5 0.1 - 0.9 x10E3/uL   EOS (ABSOLUTE) 0.1 0.0 - 0.4 x10E3/uL   Basophils Absolute 0.0 0.0 - 0.2 x10E3/uL   Immature Granulocytes 0 Not Estab. %   Immature Grans (Abs) 0.0 0.0 - 0.1 x10E3/uL  Comprehensive metabolic panel  Result Value Ref Range   Glucose 88 65 - 99 mg/dL   BUN 17 6 - 20 mg/dL   Creatinine, Ser 2.13 0.76 - 1.27 mg/dL   GFR calc non Af Amer 104 >59 mL/min/1.73   GFR calc Af Amer 120 >59 mL/min/1.73   BUN/Creatinine Ratio 17 9 - 20   Sodium 141 134 - 144 mmol/L   Potassium 4.3 3.5 - 5.2 mmol/L   Chloride 104 96 - 106 mmol/L   CO2 26 20 - 29 mmol/L   Calcium 9.1 8.7 - 10.2 mg/dL   Total Protein 6.6 6.0 - 8.5 g/dL   Albumin 4.3 3.5 - 5.5 g/dL   Globulin, Total 2.3 1.5 - 4.5 g/dL   Albumin/Globulin Ratio 1.9 1.2 - 2.2   Bilirubin Total 0.7 0.0 - 1.2 mg/dL   Alkaline Phosphatase 86 39 - 117 IU/L    AST 30 0 - 40 IU/L   ALT 14 0 - 44 IU/L  Lipid Panel w/o Chol/HDL Ratio  Result Value Ref Range   Cholesterol, Total 134 100 - 199 mg/dL   Triglycerides 70 0 - 149 mg/dL   HDL 38 (L) >08 mg/dL   VLDL Cholesterol Cal 14 5 - 40 mg/dL   LDL Calculated 82 0 - 99 mg/dL  UA/M w/rflx Culture, Routine  Result Value Ref Range   Specific Gravity, UA 1.020 1.005 - 1.030   pH, UA 7.5 5.0 - 7.5   Color, UA Yellow Yellow   Appearance Ur Clear Clear   Leukocytes, UA Negative Negative   Protein, UA Negative Negative/Trace   Glucose, UA Negative Negative   Ketones, UA Negative Negative   RBC, UA Negative Negative   Bilirubin, UA Negative Negative   Urobilinogen, Ur 0.2 0.2 - 1.0 mg/dL   Nitrite, UA Negative Negative      Assessment & Plan:   Problem List Items Addressed This Visit      Cardiovascular and Mediastinum   Migraine without aura    Will start cymablta to help decrease frequency, imitrex still prn along with OTC pain relievers      Relevant Medications   DULoxetine (CYMBALTA) 20 MG capsule     Other   Depression, major, single episode, moderate (HCC)    Switch to cymbalta in hopes of also improving migraines. Pt does not wish to pursue  counseling right now      Relevant Medications   DULoxetine (CYMBALTA) 20 MG capsule    Other Visit Diagnoses    Flu vaccine need    -  Primary   Annual physical exam       Relevant Orders   CBC with Differential/Platelet (Completed)   Comprehensive metabolic panel (Completed)   Lipid Panel w/o Chol/HDL Ratio (Completed)   UA/M w/rflx Culture, Routine (Completed)   Right wrist pain       Referral generated for orthopedics given persistence despite conservative management   Relevant Orders   AMB referral to orthopedics       Discussed aspirin prophylaxis for myocardial infarction prevention and decision was it was not indicated  LABORATORY TESTING:  Health maintenance labs ordered today as discussed above.    IMMUNIZATIONS:     - Tdap: Tetanus vaccination status reviewed: last tetanus booster within 10 years. - Influenza: Refused  PATIENT COUNSELING:    Sexuality: Discussed sexually transmitted diseases, partner selection, use of condoms, avoidance of unintended pregnancy  and contraceptive alternatives.   Advised to avoid cigarette smoking.  I discussed with the patient that most people either abstain from alcohol or drink within safe limits (<=14/week and <=4 drinks/occasion for males, <=7/weeks and <= 3 drinks/occasion for females) and that the risk for alcohol disorders and other health effects rises proportionally with the number of drinks per week and how often a drinker exceeds daily limits.  Discussed cessation/primary prevention of drug use and availability of treatment for abuse.   Diet: Encouraged to adjust caloric intake to maintain  or achieve ideal body weight, to reduce intake of dietary saturated fat and total fat, to limit sodium intake by avoiding high sodium foods and not adding table salt, and to maintain adequate dietary potassium and calcium preferably from fresh fruits, vegetables, and low-fat dairy products.    stressed the importance of regular exercise  Injury prevention: Discussed safety belts, safety helmets, smoke detector, smoking near bedding or upholstery.   Dental health: Discussed importance of regular tooth brushing, flossing, and dental visits.   Follow up plan: NEXT PREVENTATIVE PHYSICAL DUE IN 1 YEAR. Return in about 3 months (around 07/12/2018) for Migraine f/u.

## 2018-04-13 ENCOUNTER — Encounter: Payer: Self-pay | Admitting: Family Medicine

## 2018-04-13 LAB — CBC WITH DIFFERENTIAL/PLATELET
BASOS: 1 %
Basophils Absolute: 0 10*3/uL (ref 0.0–0.2)
EOS (ABSOLUTE): 0.1 10*3/uL (ref 0.0–0.4)
EOS: 1 %
HEMATOCRIT: 43.2 % (ref 37.5–51.0)
HEMOGLOBIN: 13.9 g/dL (ref 13.0–17.7)
IMMATURE GRANS (ABS): 0 10*3/uL (ref 0.0–0.1)
IMMATURE GRANULOCYTES: 0 %
LYMPHS ABS: 2.2 10*3/uL (ref 0.7–3.1)
Lymphs: 35 %
MCH: 28.3 pg (ref 26.6–33.0)
MCHC: 32.2 g/dL (ref 31.5–35.7)
MCV: 88 fL (ref 79–97)
MONOS ABS: 0.5 10*3/uL (ref 0.1–0.9)
Monocytes: 9 %
Neutrophils Absolute: 3.5 10*3/uL (ref 1.4–7.0)
Neutrophils: 54 %
Platelets: 292 10*3/uL (ref 150–450)
RBC: 4.91 x10E6/uL (ref 4.14–5.80)
RDW: 12 % — ABNORMAL LOW (ref 12.3–15.4)
WBC: 6.3 10*3/uL (ref 3.4–10.8)

## 2018-04-13 LAB — COMPREHENSIVE METABOLIC PANEL
A/G RATIO: 1.9 (ref 1.2–2.2)
ALBUMIN: 4.3 g/dL (ref 3.5–5.5)
ALT: 14 IU/L (ref 0–44)
AST: 30 IU/L (ref 0–40)
Alkaline Phosphatase: 86 IU/L (ref 39–117)
BUN/Creatinine Ratio: 17 (ref 9–20)
BUN: 17 mg/dL (ref 6–20)
Bilirubin Total: 0.7 mg/dL (ref 0.0–1.2)
CALCIUM: 9.1 mg/dL (ref 8.7–10.2)
CO2: 26 mmol/L (ref 20–29)
CREATININE: 1.02 mg/dL (ref 0.76–1.27)
Chloride: 104 mmol/L (ref 96–106)
GFR, EST AFRICAN AMERICAN: 120 mL/min/{1.73_m2} (ref >59)
GFR, EST NON AFRICAN AMERICAN: 104 mL/min/{1.73_m2} (ref >59)
GLOBULIN, TOTAL: 2.3 g/dL (ref 1.5–4.5)
Glucose: 88 mg/dL (ref 65–99)
POTASSIUM: 4.3 mmol/L (ref 3.5–5.2)
SODIUM: 141 mmol/L (ref 134–144)
TOTAL PROTEIN: 6.6 g/dL (ref 6.0–8.5)

## 2018-04-13 LAB — LIPID PANEL W/O CHOL/HDL RATIO
Cholesterol, Total: 134 mg/dL (ref 100–199)
HDL: 38 mg/dL — ABNORMAL LOW (ref >39)
LDL CALC: 82 mg/dL (ref 0–99)
TRIGLYCERIDES: 70 mg/dL (ref 0–149)
VLDL Cholesterol Cal: 14 mg/dL (ref 5–40)

## 2018-04-14 ENCOUNTER — Telehealth: Payer: Self-pay

## 2018-04-14 NOTE — Telephone Encounter (Signed)
Called pt to verify if he got the flu vaccine. Pt stated did not received the flu vaccine.

## 2018-04-14 NOTE — Telephone Encounter (Signed)
-----   Message from Particia Nearingachel Elizabeth Lane, [place] sent at 04/14/2018 10:58 AM EDT ----- Regarding: Immunization documentation His flu shot admin is still pending - can you finish that up? I am not sure if you were the one with me that day or not

## 2018-04-14 NOTE — Assessment & Plan Note (Signed)
Will start cymablta to help decrease frequency, imitrex still prn along with OTC pain relievers

## 2018-04-14 NOTE — Assessment & Plan Note (Addendum)
Switch to cymbalta in hopes of also improving migraines. Pt does not wish to pursue counseling right now

## 2018-07-08 ENCOUNTER — Ambulatory Visit: Payer: Managed Care, Other (non HMO) | Admitting: Family Medicine

## 2018-08-06 ENCOUNTER — Encounter: Payer: Self-pay | Admitting: Family Medicine

## 2019-01-13 IMAGING — CT CT HEAD W/O CM
3 series · 15 of 47 positions shown, 18 images · non-contrast
Comparison: None.

CLINICAL DATA: Headache.  Status post trip and fall.

EXAM:
CT HEAD WITHOUT CONTRAST
TECHNIQUE: Contiguous axial images were obtained from the base of the skull
through the vertex without intravenous contrast.

[Series 2: head wo · axial · 0.41mm/px · z∈[+413,+538]mm · 9 of 30 slices shown, 12 images]
[im 3/30  brain]
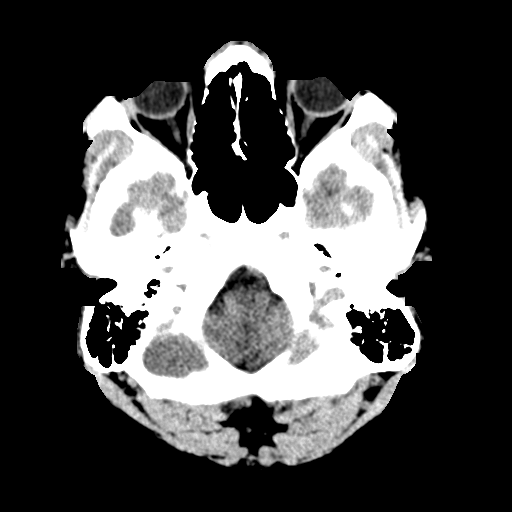
[im 3/30  bone]
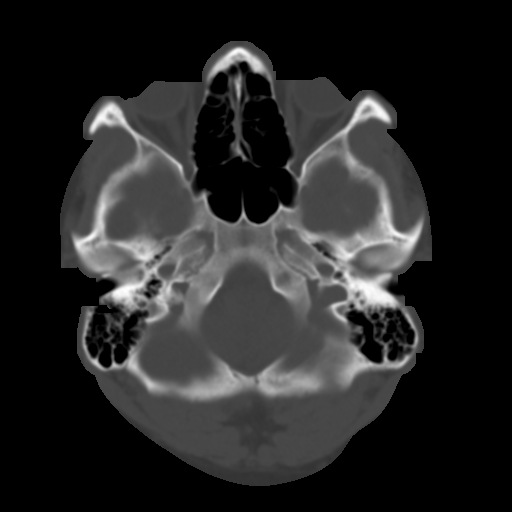
[im 6/30  brain]
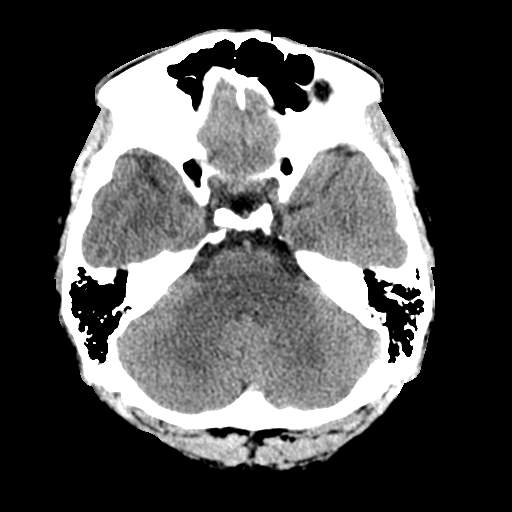
[im 9/30  brain]
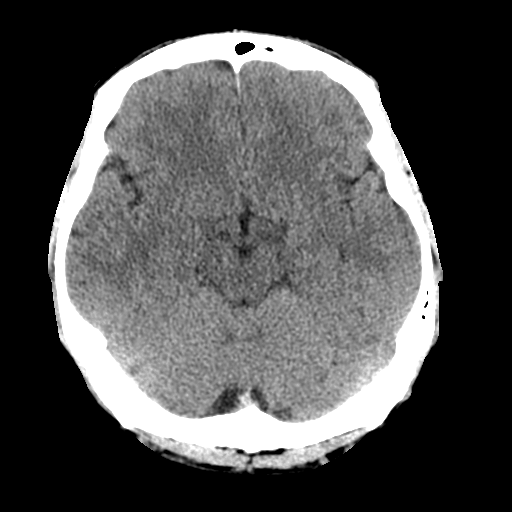
[im 12/30  brain]
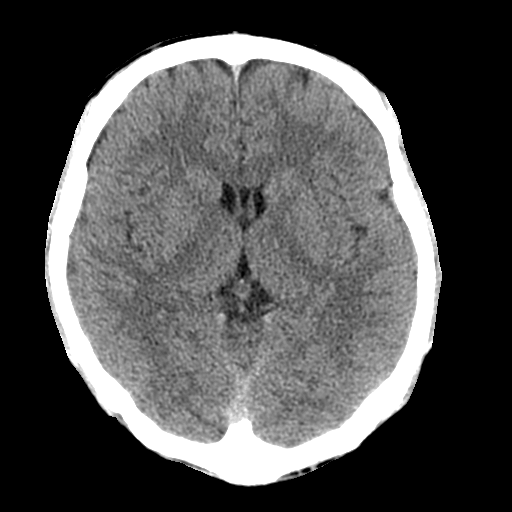
[im 16/30  brain]
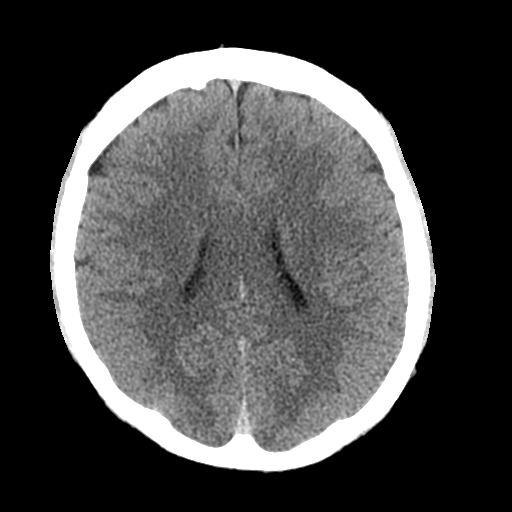
[im 16/30  bone]
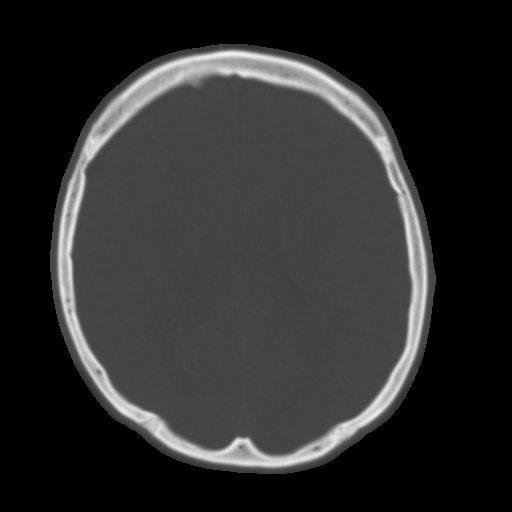
[im 19/30  brain]
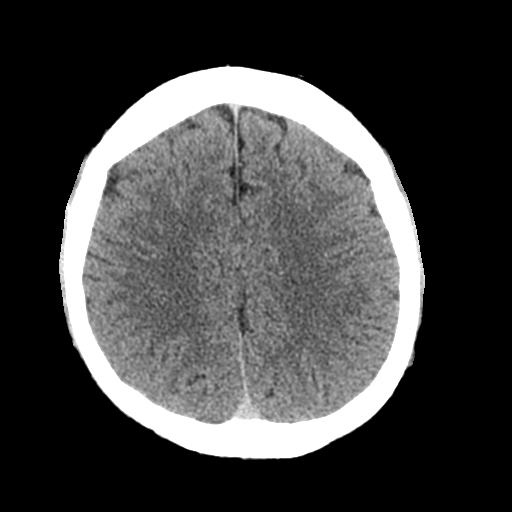
[im 22/30  brain]
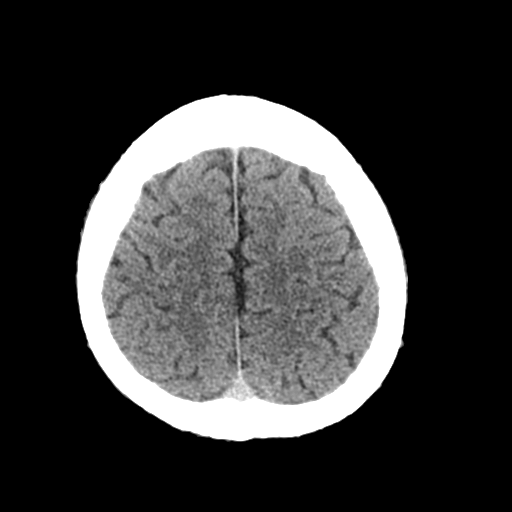
[im 25/30  brain]
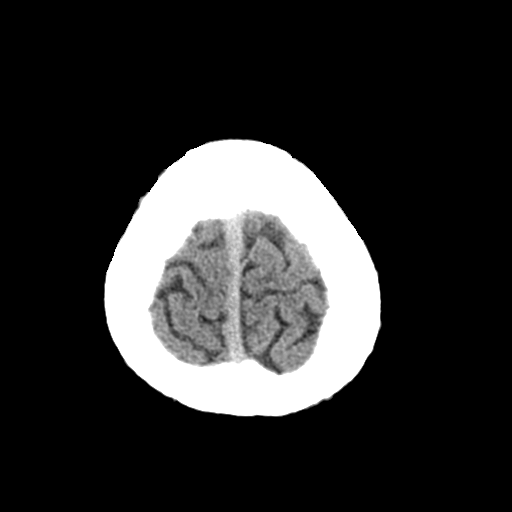
[im 28/30  brain]
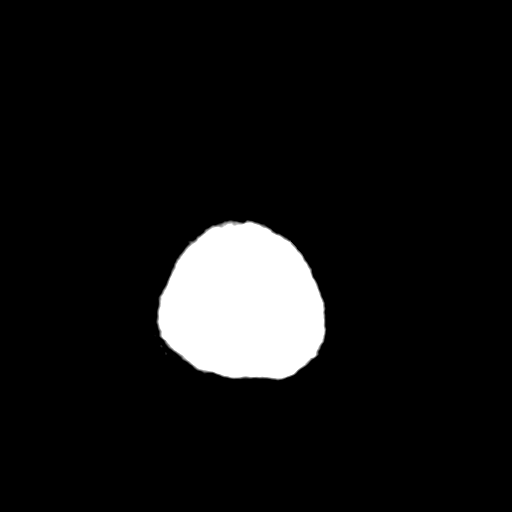
[im 28/30  bone]
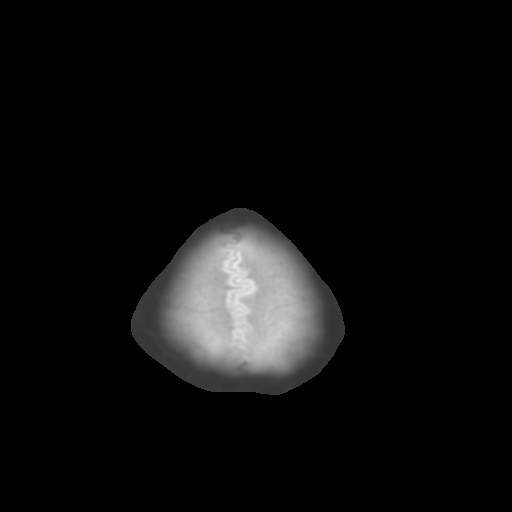

[Series 4: coronal soft tissue · coronal · 0.31mm/px · 3 of 65 slices shown]
[im 22/65  brain]
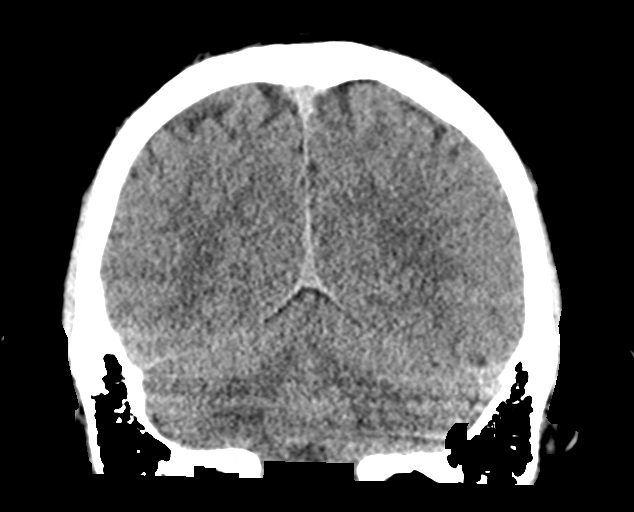
[im 29/65  brain]
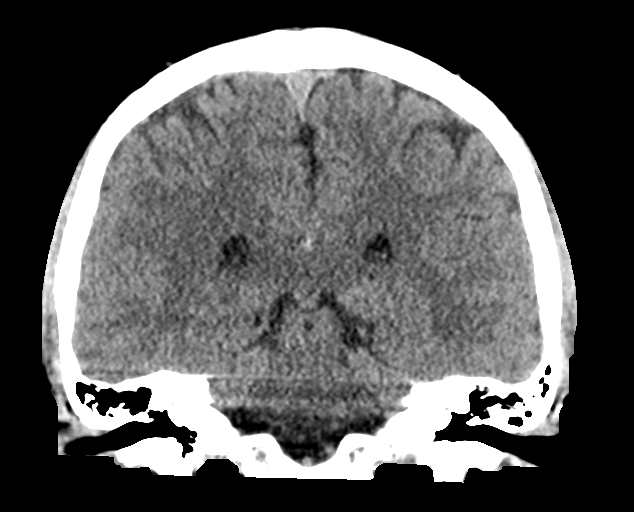
[im 36/65  brain]
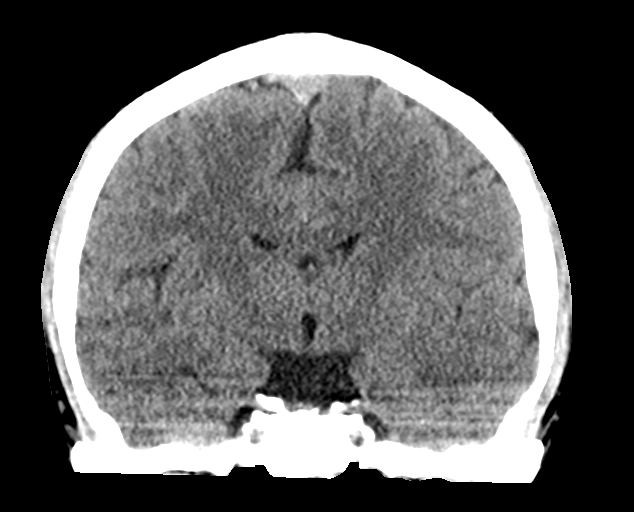

[Series 5: sagittal soft tissue · sagittal · 0.31mm/px · 3 of 62 slices shown]
[im 21/62  brain]
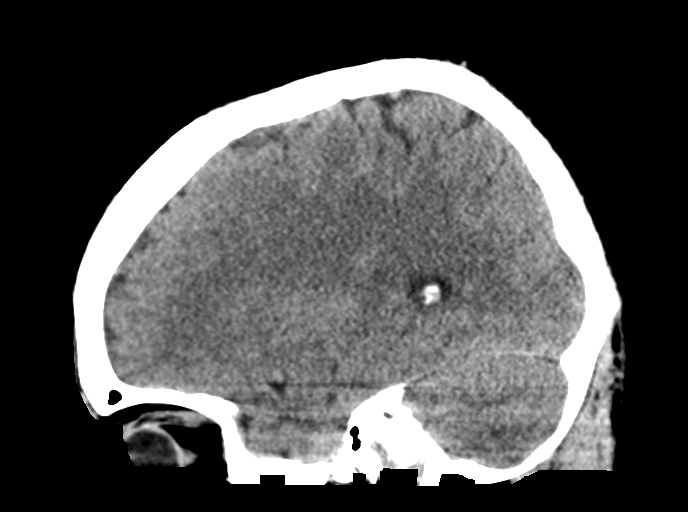
[im 31/62  brain]
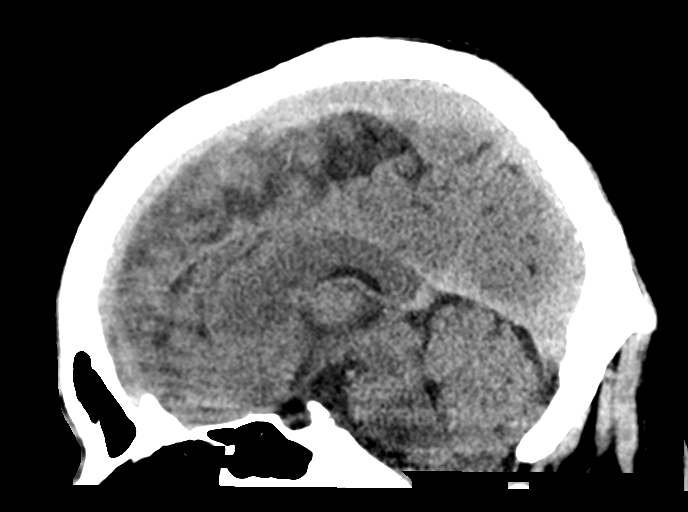
[im 41/62  brain]
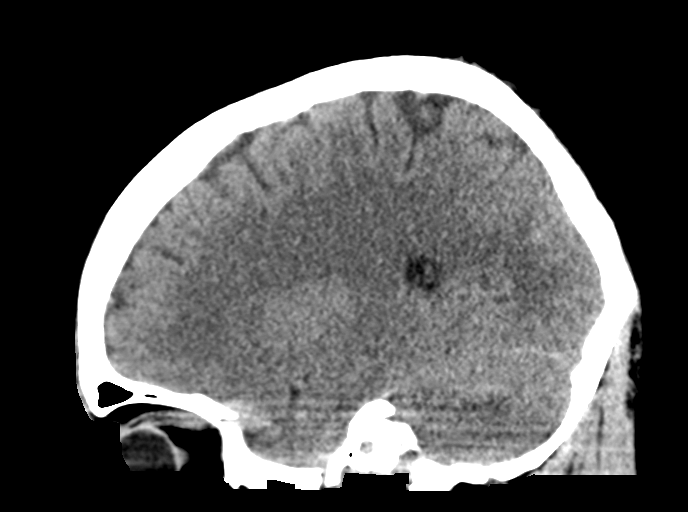

[15 of 47 positions shown; findings below may reference images not displayed]

FINDINGS: Brain: No evidence of acute infarction, hemorrhage, hydrocephalus,
extra-axial collection or mass lesion/mass effect.

Vascular: No hyperdense vessel or unexpected calcification.

Skull: The mastoid air cells and paranasal sinuses are clear. The
calvarium is intact.

Sinuses/Orbits: No acute finding.

Other: None.
IMPRESSION: 1. Normal brain.  No acute intracranial abnormalities.

## 2019-07-10 ENCOUNTER — Ambulatory Visit
Admission: EM | Admit: 2019-07-10 | Discharge: 2019-07-10 | Disposition: A | Discharge status: Home / Self Care | Payer: Managed Care, Other (non HMO) | Attending: Urgent Care | Admitting: Urgent Care

## 2019-07-10 ENCOUNTER — Other Ambulatory Visit: Payer: Self-pay

## 2019-07-10 DIAGNOSIS — Z20828 Contact with and (suspected) exposure to other viral communicable diseases: Secondary | ICD-10-CM

## 2019-07-10 DIAGNOSIS — R5383 Other fatigue: Secondary | ICD-10-CM | POA: Diagnosis not present

## 2019-07-10 DIAGNOSIS — R110 Nausea: Secondary | ICD-10-CM

## 2019-07-10 DIAGNOSIS — R509 Fever, unspecified: Secondary | ICD-10-CM

## 2019-07-10 DIAGNOSIS — J029 Acute pharyngitis, unspecified: Secondary | ICD-10-CM

## 2019-07-10 DIAGNOSIS — R05 Cough: Secondary | ICD-10-CM

## 2019-07-10 DIAGNOSIS — J069 Acute upper respiratory infection, unspecified: Secondary | ICD-10-CM

## 2019-07-10 DIAGNOSIS — Z20822 Contact with and (suspected) exposure to covid-19: Secondary | ICD-10-CM

## 2019-07-10 LAB — RAPID INFLUENZA A&B ANTIGENS (ARMC ONLY): Influenza B (ARMC): NEGATIVE

## 2019-07-10 LAB — RAPID STREP SCREEN (MED CTR MEBANE ONLY): Streptococcus, Group A Screen (Direct): NEGATIVE

## 2019-07-10 LAB — RAPID INFLUENZA A&B ANTIGENS: Influenza A (ARMC): NEGATIVE

## 2019-07-10 NOTE — Discharge Instructions (Signed)
It was very nice seeing you today in clinic. Thank you for entrusting me with your care.   Strep was negative - culture pending.  Influenza was negative.   Rest and Stay HYDRATED. Water and electrolyte containing beverages (Gatorade, Pedialyte) are best to prevent dehydration and electrolyte abnormalities. Recommend warm salt water gargles, hard candies/lozenges, and hot tea with honey/lemon to help soothe the throat and reduce irritation. May use Tylenol and/or Ibuprofen as needed for pain/fever.   You were tested for SARS-CoV-2 (novel coronavirus) today. Testing is performed by an outside lab ([facility]) and has variable turn around times ranging between 2-5 days. Current recommendations from the the CDC and [place] DHHS require that you remain out of work in order to quarantine at home until negative test results are have been received. In the event that your test results are positive, you will be contacted with further directives. These measures are being implemented out of an abundance of caution to prevent transmission and spread during the current SARS-CoV-2 pandemic.  Make arrangements to follow up with your regular doctor in 1 week for re-evaluation if not improving. If your symptoms/condition worsens, please seek follow up care either here or in the ER. Please remember, our [place] providers are "right here with you" when you need [place].   Again, it was my pleasure to take care of you today. Thank you for choosing our clinic. I hope that you start to feel better quickly.   Honor Loh, MSN, APRN, FNP-C, CEN Advanced Practice Provider [facility]

## 2019-07-10 NOTE — ED Triage Notes (Addendum)
Pt. States he started a fever/sore throat, fatigue, nasal congestion, body aches/chills started at 9pm last night. At 6am today his temp was 102, wants COVID/Strep testing.

## 2019-07-11 LAB — CULTURE, GROUP A STREP (THRC)

## 2019-07-11 LAB — NOVEL CORONAVIRUS, NAA (HOSP ORDER, SEND-OUT TO REF LAB; TAT 18-24 HRS): SARS-CoV-2, NAA: DETECTED — AB

## 2019-07-11 NOTE — ED Provider Notes (Signed)
[place], [place]   Name: Peter CrutchRonald A Bordner DOB: 07/30/1995 MRN: [id number] CSN: [id number] PCP: Steele Sizerrissman, Mark A, MD  Arrival date and time:  07/10/19 0933  Chief Complaint:  Sore Throat   NOTE: Prior to seeing the patient today, I have reviewed the triage nursing documentation and vital signs. Clinical staff has updated patient's PMH/PSHx, current medication list, and drug allergies/intolerances to ensure comprehensive history available to assist in medical decision making.   History:   HPI: Peter Petersen is a 23 y.o. male who presents today with complaints of sore throat, generalized headache, fatigue, congestion, and cough that started with acute onset last night. Patient has been running an elevated temperature, and reports that his Tmax has been 103. Fever responds appropriately to APAP; last dose earlier this morning. Patient presents with a mildly elevated temperature of 99.2. He notes that his cough has been non-productive. He denies any shortness of breath, facial pain, or otalgia. He has experienced nausea with no associated vomiting, diarrhea, or abdominal pain. He is eating and drinking well. Patient denies any perceived alterations to his sense of taste or smell. Patient denies being in close contact with anyone known to be ill; no one else is his home has experienced a similar symptom constellation. He has never been tested for SARS-CoV-2 (novel coronavirus) in the past per his report. Patient has not been vaccinated for influenza this season. In efforts to conservatively manage hissymptoms at home, the patient notes that he has used APAP, which has helped to improve his symptoms.    Past Medical History:  Diagnosis Date  . ADHD (attention deficit hyperactivity disorder)     Past Surgical History:  Procedure Laterality Date  . TONSILLECTOMY      Family History  Problem Relation Age of Onset  . Healthy Mother   . Healthy Father   . Heart disease Maternal Grandfather   .  Cancer Neg Hx   . Diabetes Neg Hx   . Hypertension Neg Hx   . Stroke Neg Hx     Social History   Tobacco Use  . Smoking status: Never Smoker  . Smokeless tobacco: Never Used  Substance Use Topics  . Alcohol use: No  . Drug use: No    Patient Active Problem List   Diagnosis Date Noted  . Somnolence, daytime 07/09/2015  . Vitamin D deficiency 06/19/2015  . Preventative health care 06/13/2015  . Blepharospasm 06/13/2015  . Screening for HIV without presence of risk factors 06/13/2015  . Screen for STD (sexually transmitted disease) 06/13/2015  . Depression, major, single episode, moderate (HCC) 06/13/2015  . Low HDL (under 40) 05/26/2015  . Migraine without aura 05/25/2015  . ADHD (attention deficit hyperactivity disorder) 05/25/2015  . Insomnia 05/25/2015  . Obesity (BMI 35.0-39.9 without comorbidity) 05/25/2015  . Goiter 05/25/2015    Home Medications:    No outpatient medications have been marked as taking for the 07/10/19 encounter [facility](Hospital Encounter).    Allergies:   Patient has no known allergies.  Review of Systems (ROS): Review of Systems  Constitutional: Positive for fatigue and fever.  HENT: Positive for congestion, postnasal drip and sore throat. Negative for ear pain, rhinorrhea, sinus pressure, sinus pain, sneezing and trouble swallowing.   Eyes: Negative for pain, discharge and redness.  Respiratory: Positive for cough. Negative for chest tightness and shortness of breath.   Cardiovascular: Negative for chest pain and palpitations.  Gastrointestinal: Positive for nausea. Negative for abdominal pain, diarrhea and vomiting.  Musculoskeletal: Negative  for arthralgias, back pain, myalgias and neck pain.  Skin: Negative for color change, pallor and rash.  Neurological: Negative for dizziness, syncope, weakness and headaches.  Hematological: Negative for adenopathy.     Vital Signs: Today's Vitals   07/10/19 0953 07/10/19 0954 07/10/19 1110  BP:  104/79    Pulse:  76   Resp:  18   Temp:  99.2 F (37.3 C)   TempSrc:  Oral   SpO2:  97%   Weight: 250 lb (113.4 kg)    PainSc: 0-No pain  0-No pain    Physical Exam: Physical Exam  Constitutional: He is oriented to person, place, and time and well-developed, well-nourished, and in no distress.  HENT:  Head: Normocephalic and atraumatic.  Right Ear: Tympanic membrane normal.  Left Ear: Tympanic membrane normal.  Nose: Mucosal edema and rhinorrhea (clear) present. No sinus tenderness.  Mouth/Throat: Uvula is midline and mucous membranes are normal. Posterior oropharyngeal edema and posterior oropharyngeal erythema (+) clear PND present. No oropharyngeal exudate.  Eyes: Pupils are equal, round, and reactive to light.  Cardiovascular: Normal rate, regular rhythm, normal heart sounds and intact distal pulses.  Pulmonary/Chest: Effort normal and breath sounds normal.  Musculoskeletal:     Cervical back: Normal range of motion and neck supple.  Lymphadenopathy:       Head (right side): Submandibular (mild; non-tender) adenopathy present.       Head (left side): Submandibular (mild; non-tender) adenopathy present.  Neurological: He is alert and oriented to person, place, and time. Gait normal.  Skin: Skin is warm and dry. No rash noted. He is not diaphoretic.  Psychiatric: Mood, memory, affect and judgment normal.  Nursing note and vitals reviewed.   Urgent Care Treatments / Results:  LABS: PLEASE NOTE: all labs that were ordered this encounter are listed, however only abnormal results are displayed. Labs Reviewed  RAPID STREP SCREEN ([facility] ONLY)  RAPID INFLUENZA A&B ANTIGENS ([facility] ONLY)  NOVEL CORONAVIRUS, NAA (HOSP ORDER, SEND-OUT TO REF LAB; TAT 18-24 HRS)  CULTURE, GROUP A STREP [facility])    EKG: -None  RADIOLOGY: No results found.  PROCEDURES: Procedures  MEDICATIONS RECEIVED THIS VISIT: Medications - No data to display  PERTINENT CLINICAL COURSE NOTES/UPDATES:     Initial Impression / Assessment and Plan / Urgent Care Course:  Pertinent labs & imaging results that were available during my care of the patient were personally reviewed by me and considered in my medical decision making (see lab/imaging section of note for values and interpretations).  ALDEN BENSINGER is a 23 y.o. male who presents to [facility] today with complaints of Sore Throat   Patient overall well appearing and in no acute distress today in clinic. Presenting symptoms (see HPI) and exam as documented above. He presents with symptoms associated with SARS-CoV-2 (novel coronavirus). Discussed typical symptom constellation. Reviewed potential for infection and need for testing. Patient amenable to being tested. SARS-CoV-2 swab collected by certified clinical staff. Discussed variable turn around times associated with testing, as swabs are being processed at [facility], and have been taking between 2-5 days to come back. He was advised to self quarantine, per [facility] DHHS guidelines, until negative results received.   Rapid streptococcal throat swab (-); reflex culture sent. Rapid influenza diagnotic test (RIDT) resulted (-) for both the influenza A and B Ag. Presenting symptoms consistent with acute viral illness. Until ruled out with confirmatory lab testing, SARS-CoV-2 remains part of the differential. His testing is pending at this  time. I discussed with him that his symptoms are felt to be viral in nature, thus antibiotics would not offer him any relief or improve his symptoms any faster than conservative symptomatic management. Cough is minor; declined offered antitussive. He also indicates that he does not feel like he needs anything to help with his nausea at this time. Discussed supportive care measures at home during acute phase of illness. Patient to rest as much as possible. He was encouraged to ensure adequate hydration (water and ORS) to prevent dehydration and electrolyte derangements.  Patient may use APAP and/or IBU on an as needed basis for pain/fever.    Current clinical condition warrants patient being out of work in order to quarantine while waiting for testing results. He was provided with the appropriate documentation to provide to his place of employment that will allow for him to RTW on 07/12/2019 with no restrictions. RTW is contingent on his SARS-CoV-2 test results being reviewed as negative. These measures are being implemented out of an abundance of caution to prevent transmission and spread during the current SARS-CoV-2 pandemic.  Discussed follow up with primary care physician in 1 week for re-evaluation. I have reviewed the follow up and strict return precautions for any new or worsening symptoms. Patient is aware of symptoms that would be deemed urgent/emergent, and would thus require further evaluation either here or in the emergency department. At the time of discharge, he verbalized understanding and consent with the discharge plan as it was reviewed with him. All questions were fielded by provider and/or clinic staff prior to patient discharge.    Final Clinical Impressions / Urgent Care Diagnoses:   Final diagnoses:  Viral URI with cough  Encounter for laboratory testing for COVID-19 virus  Pharyngitis, unspecified etiology    New Prescriptions:  [place] Controlled Substance Registry consulted? Not Applicable  No orders of the defined types were placed in this encounter.   Recommended Follow up Care:  Patient encouraged to follow up with the following provider within the specified time frame, or sooner as dictated by the severity of his symptoms. As always, he was instructed that for any urgent/emergent care needs, he should seek care either here or in the emergency department for more immediate evaluation.  Follow-up Information    Crissman, Jeannette How, MD In 1 week.   Specialty: Family Medicine Why: General reassessment of symptoms if not improving Contact  information: [place] [id number] [phone]         NOTE: This note was prepared using Dragon dictation software along with smaller phrase technology. Despite my best ability to proofread, there is the potential that transcriptional errors may still occur from this process, and are completely unintentional.    Karen Kitchens, NP 07/11/19 [phone]

## 2019-07-12 ENCOUNTER — Other Ambulatory Visit: Payer: Self-pay | Admitting: Critical Care Medicine

## 2019-07-12 ENCOUNTER — Telehealth: Payer: Self-pay | Admitting: Family Medicine

## 2019-07-12 ENCOUNTER — Encounter (HOSPITAL_COMMUNITY): Payer: Self-pay

## 2019-07-12 DIAGNOSIS — E669 Obesity, unspecified: Secondary | ICD-10-CM

## 2019-07-12 DIAGNOSIS — U071 COVID-19: Secondary | ICD-10-CM

## 2019-07-12 NOTE — Telephone Encounter (Signed)
I connected with this patient who is Covid positive from December 13 this patient is still having significant symptoms including sore throat fever and cough and is eligible for monoclonal antibody based on his BMI greater than 35  Called to discuss with patient about Covid symptoms and the use of bamlanivimab, a monoclonal antibody infusion for those with mild to moderate Covid symptoms and at a high risk of hospitalization.  Pt is qualified for this infusion at the [facility] infusion center due to BMI>35    Pt agrees to therapy

## 2019-07-12 NOTE — Progress Notes (Signed)
  I connected by phone with Peter Petersen on 07/12/2019 at 2:25 PM to discuss the potential use of an new treatment for mild to moderate COVID-19 viral infection in non-hospitalized patients.  This patient is a 23 y.o. male that meets the FDA criteria for Emergency Use Authorization of bamlanivimab .  Has a (+) direct SARS-CoV-2 viral test result  Has mild or moderate COVID-19   Is ? 23 years of age and weighs ? 40 kg  Is NOT hospitalized due to COVID-19  Is NOT requiring oxygen therapy or requiring an increase in baseline oxygen flow rate due to COVID-19  Is within 10 days of symptom onset  Has at least one of the high risk factor(s) for progression to severe COVID-19 and/or hospitalization as defined in EUA.  Specific high risk criteria : BMI >/= 35   I have spoken and communicated the following to the patient or parent/caregiver:  1. FDA has authorized the emergency use of bamlanivimab and casirivimab\imdevimab for the treatment of mild to moderate COVID-19 in adults and pediatric patients with positive results of direct SARS-CoV-2 viral testing who are 47 years of age and older weighing at least 40 kg, and who are at high risk for progressing to severe COVID-19 and/or hospitalization.  2. The significant known and potential risks and benefits of bamlanivimab and casirivimab\imdevimab, and the extent to which such potential risks and benefits are unknown.  3. Information on available alternative treatments and the risks and benefits of those alternatives, including clinical trials.  4. Patients treated with bamlanivimab and casirivimab\imdevimab should continue to self-isolate and use infection control measures (e.g., wear mask, isolate, social distance, avoid sharing personal items, clean and disinfect "high touch" surfaces, and frequent handwashing) according to CDC guidelines.   5. The patient or parent/caregiver has the option to accept or refuse bamlanivimab or  casirivimab\imdevimab .  After reviewing this information with the patient, The patient agreed to proceed with receiving the bamlanimivab infusion and will be provided a copy of the Fact sheet prior to receiving the infusion.[name] 07/12/2019 2:25 PM

## 2019-07-12 NOTE — Telephone Encounter (Signed)
Patient returned provider's call. Please f/u

## 2019-07-13 ENCOUNTER — Ambulatory Visit (HOSPITAL_COMMUNITY)
Admission: RE | Admit: 2019-07-13 | Discharge: 2019-07-13 | Disposition: A | Discharge status: Home / Self Care | Payer: Managed Care, Other (non HMO) | Source: Ambulatory Visit | Attending: Pulmonary Disease | Admitting: Pulmonary Disease

## 2019-07-13 ENCOUNTER — Encounter (HOSPITAL_COMMUNITY): Payer: Self-pay

## 2019-07-13 DIAGNOSIS — E669 Obesity, unspecified: Secondary | ICD-10-CM | POA: Diagnosis present

## 2019-07-13 DIAGNOSIS — U071 COVID-19: Secondary | ICD-10-CM

## 2019-07-13 LAB — CULTURE, GROUP A STREP (THRC)

## 2019-07-13 MED ORDER — EPINEPHRINE 0.3 MG/0.3ML IJ SOAJ: 0.3000 mg | Freq: Once | INTRAMUSCULAR | Status: DC | PRN

## 2019-07-13 MED ORDER — ALBUTEROL SULFATE HFA 108 (90 BASE) MCG/ACT IN AERS: 2.0000 | INHALATION_SPRAY | Freq: Once | RESPIRATORY_TRACT | Status: DC | PRN

## 2019-07-13 MED ORDER — DIPHENHYDRAMINE HCL 50 MG/ML IJ SOLN: 50.0000 mg | Freq: Once | INTRAMUSCULAR | Status: DC | PRN

## 2019-07-13 MED ORDER — SODIUM CHLORIDE 0.9 % IV SOLN
INTRAVENOUS | Status: DC | PRN
  Administered 2019-07-13: 250 mL via INTRAVENOUS

## 2019-07-13 MED ORDER — SODIUM CHLORIDE 0.9 % IV SOLN
700.0000 mg | Freq: Once | INTRAVENOUS | Status: AC
  Administered 2019-07-13: 12:00:00 700 mg via INTRAVENOUS
  Filled 2019-07-13: qty 20

## 2019-07-13 MED ORDER — METHYLPREDNISOLONE SODIUM SUCC 125 MG IJ SOLR: 125.0000 mg | Freq: Once | INTRAMUSCULAR | Status: DC | PRN

## 2019-07-13 MED ORDER — FAMOTIDINE IN NACL 20-0.9 MG/50ML-% IV SOLN: 20.0000 mg | Freq: Once | INTRAVENOUS | Status: DC | PRN

## 2019-07-13 NOTE — Progress Notes (Signed)
  Diagnosis: COVID-19  Physician: Dr. Jeananne Rama  Procedure: Covid Infusion Clinic Med: bamlanivimab infusion - Provided patient with bamlanimivab fact sheet for patients, parents and caregivers prior to infusion.  Complications: No immediate complications noted.  Discharge: Discharged home   Peter Petersen 07/13/2019

## 2019-07-13 NOTE — Discharge Instructions (Signed)
COVID-19: How to Protect Yourself and Others Know how it spreads  There is currently no vaccine to prevent coronavirus disease 2019 (COVID-19).  The best way to prevent illness is to avoid being exposed to this virus.  The virus is thought to spread mainly from person-to-person. ? Between people who are in close contact with one another (within about 6 feet). ? Through respiratory droplets produced when an infected person coughs, sneezes or talks. ? These droplets can land in the mouths or noses of people who are nearby or possibly be inhaled into the lungs. ? Some recent studies have suggested that COVID-19 may be spread by people who are not showing symptoms. Everyone should Clean your hands often  Wash your hands often with soap and water for at least 20 seconds especially after you have been in a public place, or after blowing your nose, coughing, or sneezing.  If soap and water are not readily available, use a hand sanitizer that contains at least 60% alcohol. Cover all surfaces of your hands and rub them together until they feel dry.  Avoid touching your eyes, nose, and mouth with unwashed hands. Avoid close contact  Stay home if you are sick.  Avoid close contact with people who are sick.  Put distance between yourself and other people. ? Remember that some people without symptoms may be able to spread virus. ? This is especially important for people who are at higher risk of getting very sick.www.cdc.gov/coronavirus/2019-ncov/need-extra-precautions/people-at-higher-risk.html Cover your mouth and nose with a cloth face cover when around others  You could spread COVID-19 to others even if you do not feel sick.  Everyone should wear a cloth face cover when they have to go out in public, for example to the grocery store or to pick up other necessities. ? Cloth face coverings should not be placed on young children under age 2, anyone who has trouble breathing, or is unconscious,  incapacitated or otherwise unable to remove the mask without assistance.  The cloth face cover is meant to protect other people in case you are infected.  Do NOT use a facemask meant for a healthcare worker.  Continue to keep about 6 feet between yourself and others. The cloth face cover is not a substitute for social distancing. Cover coughs and sneezes  If you are in a private setting and do not have on your cloth face covering, remember to always cover your mouth and nose with a tissue when you cough or sneeze or use the inside of your elbow.  Throw used tissues in the trash.  Immediately wash your hands with soap and water for at least 20 seconds. If soap and water are not readily available, clean your hands with a hand sanitizer that contains at least 60% alcohol. Clean and disinfect  Clean AND disinfect frequently touched surfaces daily. This includes tables, doorknobs, light switches, countertops, handles, desks, phones, keyboards, toilets, faucets, and sinks. www.cdc.gov/coronavirus/2019-ncov/prevent-getting-sick/disinfecting-your-home.html  If surfaces are dirty, clean them: Use detergent or soap and water prior to disinfection.  Then, use a household disinfectant. You can see a list of EPA-registered household disinfectants here. cdc.gov/coronavirus 11/30/2018 This information is not intended to replace advice given to you by your health care provider. Make sure you discuss any questions you have with your health care provider. Document Released: 11/09/2018 Document Revised: 12/08/2018 Document Reviewed: 11/09/2018 Elsevier Patient Education  2020 [employer]  

## 2019-07-14 ENCOUNTER — Ambulatory Visit (HOSPITAL_COMMUNITY): Payer: Managed Care, Other (non HMO)

## 2020-03-04 ENCOUNTER — Other Ambulatory Visit: Payer: Self-pay

## 2020-03-04 ENCOUNTER — Ambulatory Visit
Admission: EM | Admit: 2020-03-04 | Discharge: 2020-03-04 | Disposition: A | Discharge status: Home / Self Care | Payer: Managed Care, Other (non HMO) | Attending: Family Medicine | Admitting: Family Medicine

## 2020-03-04 DIAGNOSIS — R05 Cough: Secondary | ICD-10-CM | POA: Insufficient documentation

## 2020-03-04 DIAGNOSIS — E669 Obesity, unspecified: Secondary | ICD-10-CM | POA: Diagnosis not present

## 2020-03-04 DIAGNOSIS — J029 Acute pharyngitis, unspecified: Secondary | ICD-10-CM | POA: Insufficient documentation

## 2020-03-04 DIAGNOSIS — R519 Headache, unspecified: Secondary | ICD-10-CM | POA: Insufficient documentation

## 2020-03-04 DIAGNOSIS — Z20822 Contact with and (suspected) exposure to covid-19: Secondary | ICD-10-CM | POA: Diagnosis not present

## 2020-03-04 DIAGNOSIS — Z6841 Body Mass Index (BMI) 40.0 and over, adult: Secondary | ICD-10-CM | POA: Insufficient documentation

## 2020-03-04 DIAGNOSIS — Z79899 Other long term (current) drug therapy: Secondary | ICD-10-CM | POA: Insufficient documentation

## 2020-03-04 DIAGNOSIS — F329 Major depressive disorder, single episode, unspecified: Secondary | ICD-10-CM | POA: Diagnosis not present

## 2020-03-04 DIAGNOSIS — R0602 Shortness of breath: Secondary | ICD-10-CM | POA: Insufficient documentation

## 2020-03-04 DIAGNOSIS — J069 Acute upper respiratory infection, unspecified: Secondary | ICD-10-CM

## 2020-03-04 NOTE — ED Triage Notes (Signed)
Patient complains of sore throat and cough that started last night. States that he has had some shortness of breath but that has continued to linger since having Covid in December. States that he has also been fully vaccinated.

## 2020-03-04 NOTE — Discharge Instructions (Signed)
Results available in 24 hours.  Stay home.  Take care  Dr. Tyreshia Ingman   

## 2020-03-04 NOTE — ED Provider Notes (Signed)
[facility]    CSN: [id number] Arrival date & time: 03/04/20  0920  History   Chief Complaint Chief Complaint  Patient presents with  . Sore Throat   HPI  24 year old male presents with sore throat, rhinorrhea, cough, ongoing shortness of breath, and headache.  Patient reports that he has chronic shortness of breath after having COVID-19 in December.  He reports that he the remainder of his symptoms started last night.  He reports cough, sore throat, headache, rhinorrhea.  He states that his workplace requires him to be evaluated and tested for COVID-19.  No reported direct contact with COVID-19.  No relieving factors.  No fever.  No other complaints at this time.  Past Medical History:  Diagnosis Date  . ADHD (attention deficit hyperactivity disorder)     Patient Active Problem List   Diagnosis Date Noted  . Somnolence, daytime 07/09/2015  . Vitamin D deficiency 06/19/2015  . Preventative health care 06/13/2015  . Blepharospasm 06/13/2015  . Screening for HIV without presence of risk factors 06/13/2015  . Screen for STD (sexually transmitted disease) 06/13/2015  . Depression, major, single episode, moderate (HCC) 06/13/2015  . Low HDL (under 40) 05/26/2015  . Migraine without aura 05/25/2015  . ADHD (attention deficit hyperactivity disorder) 05/25/2015  . Insomnia 05/25/2015  . Obesity (BMI 35.0-39.9 without comorbidity) 05/25/2015  . Goiter 05/25/2015   Past Surgical History:  Procedure Laterality Date  . TONSILLECTOMY     Home Medications    Prior to Admission medications   Medication Sig Start Date End Date Taking? Authorizing Provider  DULoxetine (CYMBALTA) 20 MG capsule Take 1 capsule (20 mg total) by mouth daily. 04/12/18 03/04/20  Particia Nearing, PA-C  SUMAtriptan (IMITREX) 50 MG tablet Take 1 tablet (50 mg total) by mouth every 2 (two) hours as needed for migraine. May repeat in 2 hours if headache persists or recurs. 02/10/18 03/04/20  Particia Nearing, PA-C    Family History Family History  Problem Relation Age of Onset  . Healthy Mother   . Healthy Father   . Heart disease Maternal Grandfather   . Cancer Neg Hx   . Diabetes Neg Hx   . Hypertension Neg Hx   . Stroke Neg Hx     Social History Social History   Tobacco Use  . Smoking status: Never Smoker  . Smokeless tobacco: Never Used  Vaping Use  . Vaping Use: Never used  Substance Use Topics  . Alcohol use: No  . Drug use: No     Allergies   Patient has no known allergies.   Review of Systems Review of Systems  Constitutional: Negative for fever.  HENT: Positive for rhinorrhea and sore throat.   Respiratory: Positive for cough and shortness of breath.   Neurological: Positive for headaches.   Physical Exam Triage Vital Signs ED Triage Vitals  Enc Vitals Group     BP 03/04/20 0945 133/71     Pulse Rate 03/04/20 0945 74     Resp 03/04/20 0945 18     Temp 03/04/20 0945 98.4 F (36.9 C)     Temp Source 03/04/20 0945 Oral     SpO2 03/04/20 0945 100 %     Weight 03/04/20 0942 250 lb (113.4 kg)     Height 03/04/20 0942 5' 6.2" (1.681 m)     Head Circumference --      Peak Flow --      Pain Score 03/04/20 0942 4  Pain Loc --      Pain Edu? --      Excl. in GC? --    Updated Vital Signs BP 133/71 (BP Location: Left Arm)   Pulse 74   Temp 98.4 F (36.9 C) (Oral)   Resp 18   Ht 5' 6.2" (1.681 m)   Wt 113.4 kg   SpO2 100%   BMI 40.11 kg/m   Visual Acuity Right Eye Distance:   Left Eye Distance:   Bilateral Distance:    Right Eye Near:   Left Eye Near:    Bilateral Near:     Physical Exam Vitals and nursing note reviewed.  Constitutional:      General: He is not in acute distress.    Appearance: Normal appearance. He is obese. He is not ill-appearing.  HENT:     Head: Normocephalic and atraumatic.     Right Ear: Tympanic membrane normal.     Left Ear: Tympanic membrane normal.     Mouth/Throat:     Pharynx:  Oropharynx is clear. No oropharyngeal exudate or posterior oropharyngeal erythema.  Cardiovascular:     Rate and Rhythm: Normal rate and regular rhythm.     Heart sounds: No murmur heard.   Pulmonary:     Effort: Pulmonary effort is normal.     Breath sounds: Normal breath sounds. No wheezing or rales.  Neurological:     Mental Status: He is alert.  Psychiatric:        Mood and Affect: Mood normal.    UC Treatments / Results  Labs (all labs ordered are listed, but only abnormal results are displayed) Labs Reviewed  SARS CORONAVIRUS 2 (TAT 6-24 HRS)    EKG   Radiology No results found.  Procedures Procedures (including critical care time)  Medications Ordered in UC Medications - No data to display  Initial Impression / Assessment and Plan / UC Course  I have reviewed the triage vital signs and the nursing notes.  Pertinent labs & imaging results that were available during my care of the patient were reviewed by me and considered in my medical decision making (see chart for details).     24 year old male presents with viral URI with cough.  Patient is concerned about the possibility of COVID-19.  Awaiting test results.  Advised supportive care and over-the-counter medications as needed.  Final Clinical Impressions(s) / UC Diagnoses   Final diagnoses:  Viral URI with cough     Discharge Instructions     Results available in 24 hours.  Stay home.  Take care  Dr. Adriana Simas     ED Prescriptions    None     PDMP not reviewed this encounter.   Tommie Sams, [place] 03/04/20 1039

## 2020-03-05 LAB — SARS CORONAVIRUS 2 (TAT 6-24 HRS): SARS Coronavirus 2: NEGATIVE

## 2020-05-23 ENCOUNTER — Ambulatory Visit (INDEPENDENT_AMBULATORY_CARE_PROVIDER_SITE_OTHER): Payer: Managed Care, Other (non HMO)

## 2020-05-23 ENCOUNTER — Other Ambulatory Visit: Payer: Self-pay

## 2020-05-23 ENCOUNTER — Ambulatory Visit: Payer: Self-pay | Admitting: *Deleted

## 2020-05-23 ENCOUNTER — Ambulatory Visit
Admission: EM | Admit: 2020-05-23 | Discharge: 2020-05-23 | Disposition: A | Discharge status: Home / Self Care | Payer: Managed Care, Other (non HMO) | Attending: Emergency Medicine | Admitting: Emergency Medicine

## 2020-05-23 DIAGNOSIS — R0602 Shortness of breath: Secondary | ICD-10-CM | POA: Diagnosis not present

## 2020-05-23 DIAGNOSIS — U099 Post covid-19 condition, unspecified: Secondary | ICD-10-CM

## 2020-05-23 DIAGNOSIS — R42 Dizziness and giddiness: Secondary | ICD-10-CM

## 2020-05-23 MED ORDER — AEROCHAMBER MV MISC: 2 refills | Status: AC

## 2020-05-23 MED ORDER — ALBUTEROL SULFATE HFA 108 (90 BASE) MCG/ACT IN AERS: 2.0000 | INHALATION_SPRAY | RESPIRATORY_TRACT | 0 refills | Status: DC | PRN

## 2020-05-23 NOTE — Discharge Instructions (Signed)
Use the albuterol inhaler with the spacer, 2 puffs every 4-6 hours as needed, for shortness of breath.  I have sent a referral over to the Covid long-haul clinic and they should be reaching out to you.  If your symptoms continue follow-up with your primary care provider.

## 2020-05-23 NOTE — ED Provider Notes (Signed)
[facility]    CSN: [id number] Arrival date & time: 05/23/20  1551      History   Chief Complaint Chief Complaint  Patient presents with   Shortness of Breath    HPI Peter Petersen is a 24 y.o. male.   24 year old male here for evaluation of shortness of breath.  Patient states that he has had shortness of breath since contracting Covid in December 2020.  He says he did not get pneumonia at the time he had Covid because he was given monoclonal antibody infusion.  He states that he has been becoming used to the feeling of shortness of breath but today it was worse and was associated with some dizziness.  Patient reports that he did exercise today on the elliptical and did fine.  After his workout he said he had a feeling like he was being choked and the dizziness came on.  He denies cough or wheezing or fever.  He has never followed up with anybody since having Covid and he is not tried an inhaler for his symptoms.     Past Medical History:  Diagnosis Date   ADHD (attention deficit hyperactivity disorder)     Patient Active Problem List   Diagnosis Date Noted   Somnolence, daytime 07/09/2015   Vitamin D deficiency 06/19/2015   Preventative health care 06/13/2015   Blepharospasm 06/13/2015   Screening for HIV without presence of risk factors 06/13/2015   Screen for STD (sexually transmitted disease) 06/13/2015   Depression, major, single episode, moderate (HCC) 06/13/2015   Low HDL (under 40) 05/26/2015   Migraine without aura 05/25/2015   ADHD (attention deficit hyperactivity disorder) 05/25/2015   Insomnia 05/25/2015   Obesity (BMI 35.0-39.9 without comorbidity) 05/25/2015   Goiter 05/25/2015    Past Surgical History:  Procedure Laterality Date   DENTAL SURGERY Bilateral 2019   TONSILLECTOMY         Home Medications    Prior to Admission medications   Medication Sig Start Date End Date Taking? Authorizing Provider  albuterol  (VENTOLIN HFA) 108 (90 Base) MCG/ACT inhaler Inhale 2 puffs into the lungs every 4 (four) hours as needed for wheezing or shortness of breath. 05/23/20   Becky Augusta, NP  Spacer/Aero-Holding Chambers (AEROCHAMBER MV) inhaler Use as instructed 05/23/20   Becky Augusta, NP  DULoxetine (CYMBALTA) 20 MG capsule Take 1 capsule (20 mg total) by mouth daily. 04/12/18 03/04/20  Particia Nearing, PA-C  SUMAtriptan (IMITREX) 50 MG tablet Take 1 tablet (50 mg total) by mouth every 2 (two) hours as needed for migraine. May repeat in 2 hours if headache persists or recurs. 02/10/18 03/04/20  Particia Nearing, PA-C    Family History Family History  Problem Relation Age of Onset   Healthy Mother    Healthy Father    Heart disease Maternal Grandfather    Cancer Neg Hx    Diabetes Neg Hx    Hypertension Neg Hx    Stroke Neg Hx     Social History Social History   Tobacco Use   Smoking status: Never Smoker   Smokeless tobacco: Never Used  Building services engineer Use: Never used  Substance Use Topics   Alcohol use: No   Drug use: Not Currently     Allergies   Patient has no known allergies.   Review of Systems Review of Systems  Constitutional: Negative for activity change, appetite change and fever.  HENT: Negative for congestion, rhinorrhea and sore throat.  Respiratory: Positive for choking and shortness of breath. Negative for cough and wheezing.   Cardiovascular: Negative for chest pain, palpitations and leg swelling.  Gastrointestinal: Negative for nausea and vomiting.  Genitourinary: Negative for frequency.  Musculoskeletal: Negative for arthralgias and myalgias.  Skin: Negative for rash.  Neurological: Positive for dizziness. Negative for syncope and headaches.  Hematological: Negative.   Psychiatric/Behavioral: Negative.      Physical Exam Triage Vital Signs ED Triage Vitals  Enc Vitals Group     BP 05/23/20 1601 (S) (!) 153/128     Pulse Rate 05/23/20  1601 70     Resp 05/23/20 1601 18     Temp 05/23/20 1601 98.4 F (36.9 C)     Temp Source 05/23/20 1601 Oral     SpO2 05/23/20 1601 100 %     Weight 05/23/20 1600 250 lb (113.4 kg)     Height --      Head Circumference --      Peak Flow --      Pain Score 05/23/20 1600 0     Pain Loc --      Pain Edu? --      Excl. in GC? --    No data found.  Updated Vital Signs BP 125/81    Pulse 70    Temp 98.4 F (36.9 C) (Oral)    Resp 18    Wt 250 lb (113.4 kg)    SpO2 100%    BMI 40.11 kg/m   Visual Acuity Right Eye Distance:   Left Eye Distance:   Bilateral Distance:    Right Eye Near:   Left Eye Near:    Bilateral Near:     Physical Exam Vitals and nursing note reviewed.  Constitutional:      General: He is not in acute distress.    Appearance: He is well-developed. He is not ill-appearing, toxic-appearing or diaphoretic.  HENT:     Head: Normocephalic and atraumatic.  Eyes:     Extraocular Movements: Extraocular movements intact.     Pupils: Pupils are equal, round, and reactive to light.  Cardiovascular:     Rate and Rhythm: Normal rate and regular rhythm.  Pulmonary:     Effort: Pulmonary effort is normal. No respiratory distress.     Breath sounds: Examination of the right-upper field reveals decreased breath sounds. Examination of the left-upper field reveals decreased breath sounds. Examination of the right-middle field reveals decreased breath sounds. Examination of the left-middle field reveals decreased breath sounds. Examination of the right-lower field reveals decreased breath sounds. Examination of the left-lower field reveals decreased breath sounds. Decreased breath sounds present. No wheezing, rhonchi or rales.  Chest:     Chest wall: No deformity, tenderness or edema.  Musculoskeletal:        General: Normal range of motion.     Cervical back: Normal range of motion and neck supple.     Right lower leg: No tenderness.     Left lower leg: No tenderness.    Lymphadenopathy:     Cervical: No cervical adenopathy.  Skin:    General: Skin is warm and dry.     Capillary Refill: Capillary refill takes less than 2 seconds.     Findings: No erythema or rash.  Neurological:     General: No focal deficit present.     Mental Status: He is alert and oriented to person, place, and time.  Psychiatric:        Mood and Affect:  Mood normal.        Behavior: Behavior normal.      UC Treatments / Results  Labs (all labs ordered are listed, but only abnormal results are displayed) Labs Reviewed - No data to display  EKG   Radiology DG Chest 2 View  Result Date: 05/23/2020 CLINICAL DATA:  Patient presents to Urgent Care with complaints of SOB since COVID dx. Patient reports this has been his new "normal" for him since his diagnosis but today he felt dizzy and SOB. He called his pcp and was instructed to come to urgent care. EXAM: CHEST - 2 VIEW COMPARISON:  none FINDINGS: Lungs are clear. Heart size and mediastinal contours are within normal limits. No effusion.  No pneumothorax. Visualized bones unremarkable. IMPRESSION: No acute cardiopulmonary disease. Electronically Signed   By: Corlis Leak M.D.   On: 05/23/2020 16:36    Procedures Procedures (including critical care time)  Medications Ordered in UC Medications - No data to display  Initial Impression / Assessment and Plan / UC Course  I have reviewed the triage vital signs and the nursing notes.  Pertinent labs & imaging results that were available during my care of the patient were reviewed by me and considered in my medical decision making (see chart for details).   Patient is here for evaluation of shortness of breath had dizziness and a feeling of choking associated with it after his exercise routine this morning on the elliptical.  Patient reports that he has had shortness of breath since his Covid diagnosis in December 2020.  He has never seen anybody about this or had follow-up.  He  has never tried an inhaler for his symptoms.  Patient is in no acute distress.  Pulmonary exam reveals decreased lung sounds diffusely without wheezes or rhonchi.  Will obtain chest x-ray.  If chest x-ray is negative will DC home with inhaler for shortness of breath and refer him to the Covid long-haul clinic at [facility].   Chest x-ray independently evaluated by me.  No evidence of pneumothorax or infiltrate.  Will await radiology overread.  Radiology read concurs with my own.  Will discharge patient home with albuterol inhaler and spacer for shortness of breath.  Will refer to  long-haul Covid clinic.  Final Clinical Impressions(s) / UC Diagnoses   Final diagnoses:  SOB (shortness of breath)     Discharge Instructions     Use the albuterol inhaler with the spacer, 2 puffs every 4-6 hours as needed, for shortness of breath.  I have sent a referral over to the Covid long-haul clinic and they should be reaching out to you.  If your symptoms continue follow-up with your primary care provider.    ED Prescriptions    Medication Sig Dispense Auth. Provider   albuterol (VENTOLIN HFA) 108 (90 Base) MCG/ACT inhaler Inhale 2 puffs into the lungs every 4 (four) hours as needed for wheezing or shortness of breath. 18 g Becky Augusta, NP   Spacer/Aero-Holding Chambers (AEROCHAMBER MV) inhaler Use as instructed 1 each Becky Augusta, NP     PDMP not reviewed this encounter.   Becky Augusta, NP 05/23/20 1645

## 2020-05-23 NOTE — ED Triage Notes (Signed)
Patient presents to Urgent Care with complaints of SOB since COVID dx. Patient reports this has been his new "normal" for him since his diagnosis but today he felt dizzy and SOB. He called his pcp and was instructed to come to urgent care.

## 2020-05-23 NOTE — Telephone Encounter (Signed)
Patient is calling to report that since he had COVID and the infusion treatment- he has had SOB- he states that he gets winded when talking and really is not able to be as active as he wants due to the SOB. Patient states he had episode today where he got SOB and dizzy at the gym- due to increasing symptoms- advised UC-no appointment available in office today. Patient was also given information on the post COVID clinic. Patient states he will go to UC today and will follow up with the office.  Reason for Disposition . [1] MILD difficulty breathing (e.g., minimal/no SOB at rest, SOB with walking, pulse <100) AND [2] NEW-onset or WORSE than normal  Answer Assessment - Initial Assessment Questions 1. RESPIRATORY STATUS: "Describe your breathing?" (e.g., wheezing, shortness of breath, unable to speak, severe coughing)      SOB- patient has had breathing issues since having COVID- patient has noticed he has had SOB worse 2. ONSET: "When did this breathing problem begin?"      COVID- December 3. PATTERN "Does the difficult breathing come and go, or has it been constant since it started?"      Constant all the time- last night got worse 4. SEVERITY: "How bad is your breathing?" (e.g., mild, moderate, severe)    - MILD: No SOB at rest, mild SOB with walking, speaks normally in sentences, can lay down, no retractions, pulse < 100.    - MODERATE: SOB at rest, SOB with minimal exertion and prefers to sit, cannot lie down flat, speaks in phrases, mild retractions, audible wheezing, pulse 100-120.    - SEVERE: Very SOB at rest, speaks in single words, struggling to breathe, sitting hunched forward, retractions, pulse > 120      moderate 5. RECURRENT SYMPTOM: "Have you had difficulty breathing before?" If Yes, ask: "When was the last time?" and "What happened that time?"      no 6. CARDIAC HISTORY: "Do you have any history of heart disease?" (e.g., heart attack, angina, bypass surgery, angioplasty)      no 7.  LUNG HISTORY: "Do you have any history of lung disease?"  (e.g., pulmonary embolus, asthma, emphysema)     no 8. CAUSE: "What do you think is causing the breathing problem?"      Post COVID, overweight 9. OTHER SYMPTOMS: "Do you have any other symptoms? (e.g., dizziness, runny nose, cough, chest pain, fever)     dizziness 10. PREGNANCY: "Is there any chance you are pregnant?" "When was your last menstrual period?"       n/a 11. TRAVEL: "Have you traveled out of the country in the last month?" (e.g., travel history, exposures)       no  Protocols used: BREATHING DIFFICULTY-A-AH

## 2020-05-30 ENCOUNTER — Encounter: Payer: Self-pay | Admitting: Physical Medicine & Rehabilitation

## 2020-06-18 ENCOUNTER — Other Ambulatory Visit: Payer: Self-pay | Admitting: Family Medicine

## 2020-06-18 NOTE — Telephone Encounter (Signed)
Returned call to patient.Patient requesting a refill of Albuterol inhaler that he was prescribed when seen at Urgent Care Patient states that he has been experiencing SOB since being diagnosed with COVID last year.Patient advised that he would need to schedule an appointment with a provider in order to continue to receive refills. Virtual appt scheduled on 06/26/20. Patient advised to be seen at [facility] or ED for current symptoms due to the increased use of the inhaler. Patient verbalized understanding and states that he will go to UC after work today.

## 2020-06-18 NOTE — Telephone Encounter (Signed)
Called and scheduled pt for tomorrow with Dr Linwood Dibbles

## 2020-06-18 NOTE — Telephone Encounter (Signed)
Pt called asking for a refill on his inhaler.  He had covid last December and ended up in a urgent care and they prescribed him the inhaler.  [facility] in [place]   CB#  [phone]

## 2020-06-19 ENCOUNTER — Ambulatory Visit: Payer: Managed Care, Other (non HMO) | Admitting: Family Medicine

## 2020-06-19 NOTE — Progress Notes (Deleted)
    SUBJECTIVE:   CHIEF COMPLAINT / HPI:   SHORTNESS OF BREATH - ever since contracting COVID 06/2019. Received albuterol inhaler 05/23/20 at UC. Referred to [facility] long-haul clinic at that time.  Has been short of breath for *** days. Prevents from doing: *** Short of breath with: *** New Medications: *** Patient believes may be caused by ***  Kidney problems: *** Heart problems: *** History of cancer: ***  Symptoms Fever: *** Sputum: *** Wheezing or asthma: *** Leg swelling: *** Chest Pain: *** Immobility: *** Stomach pain or black bowel movements: *** Severe snoring or daytime sleepiness: *** Weight loss: ***    PERTINENT  PMH / PSH: migraine, goiter, ADHD, depression, insomnia, obesity  OBJECTIVE:   There were no vitals taken for this visit.  ***  ASSESSMENT/PLAN:   No problem-specific Assessment & Plan notes found for this encounter.     Caro Laroche, DO [facility]

## 2020-06-26 ENCOUNTER — Telehealth: Payer: Self-pay | Admitting: Nurse Practitioner

## 2020-07-02 ENCOUNTER — Encounter: Payer: Self-pay | Admitting: Physical Medicine & Rehabilitation

## 2020-07-02 ENCOUNTER — Other Ambulatory Visit: Payer: Self-pay

## 2020-07-02 ENCOUNTER — Encounter
Payer: Managed Care, Other (non HMO) | Attending: Physical Medicine & Rehabilitation | Admitting: Physical Medicine & Rehabilitation

## 2020-07-02 VITALS — BP 115/75 | HR 55 | Temp 98.2°F | Ht 65.0 in | Wt 259.2 lb

## 2020-07-02 DIAGNOSIS — U099 Post covid-19 condition, unspecified: Secondary | ICD-10-CM | POA: Diagnosis present

## 2020-07-02 DIAGNOSIS — R0609 Other forms of dyspnea: Secondary | ICD-10-CM | POA: Diagnosis not present

## 2020-07-02 MED ORDER — ALBUTEROL SULFATE HFA 108 (90 BASE) MCG/ACT IN AERS: 2.0000 | INHALATION_SPRAY | RESPIRATORY_TRACT | 0 refills | Status: AC | PRN

## 2020-07-02 NOTE — Progress Notes (Signed)
Subjective:    Patient ID: Peter Petersen, male    DOB: January 02, 1996, 24 y.o.   MRN: [id number]  HPI  Male with pmh ADHD presents with prolonged post covid syndrome. Had Covid in 06/2019.  Since that time he has been having in SOB.  Stable.  He went to urgent care due feeling of drowning. He is limited in his ability to go to the gym. Denies personal or familial history of respiratory complaints.  He was prescribed albuterol with benefit. Talking exacerbates symptoms. Intermittent. Denies associated symptoms. Denies falls. SOB limits exercise.   Pain Inventory Average Pain 0 Pain Right Now 0 My pain is no pain  In the last 24 hours, has pain interfered with the following? General activity 4 Relation with others 5 Enjoyment of life 7 What TIME of day is your pain at its worst? varies Sleep (in general) Good  Pain is worse with: no pain Pain improves with: no pain Relief from Meds: no pain  walk without assistance ability to climb steps?  yes do you drive?  yes  employed # of hrs/week 40  No problems in this area  new pt  new pt    Family History  Problem Relation Age of Onset  . Healthy Mother   . Healthy Father   . Heart disease Maternal Grandfather   . Cancer Neg Hx   . Diabetes Neg Hx   . Hypertension Neg Hx   . Stroke Neg Hx    Social History   Socioeconomic History  . Marital status: Single    Spouse name: Not on file  . Number of children: Not on file  . Years of education: Not on file  . Highest education level: Not on file  Occupational History  . Not on file  Tobacco Use  . Smoking status: Never Smoker  . Smokeless tobacco: Never Used  Vaping Use  . Vaping Use: Never used  Substance and Sexual Activity  . Alcohol use: No  . Drug use: Not Currently  . Sexual activity: Not on file  Other Topics Concern  . Not on file  Social History Narrative  . Not on file   Social Determinants of Health   Financial Resource Strain:   . Difficulty of  Paying Living Expenses: Not on file  Food Insecurity:   . Worried About Programme researcher, broadcasting/film/video in the Last Year: Not on file  . Ran Out of Food in the Last Year: Not on file  Transportation Needs:   . Lack of Transportation (Medical): Not on file  . Lack of Transportation (Non-Medical): Not on file  Physical Activity:   . Days of Exercise per Week: Not on file  . Minutes of Exercise per Session: Not on file  Stress:   . Feeling of Stress : Not on file  Social Connections:   . Frequency of Communication with Friends and Family: Not on file  . Frequency of Social Gatherings with Friends and Family: Not on file  . Attends Religious Services: Not on file  . Active Member of Clubs or Organizations: Not on file  . Attends Banker Meetings: Not on file  . Marital Status: Not on file   Past Surgical History:  Procedure Laterality Date  . DENTAL SURGERY Bilateral 2019  . TONSILLECTOMY     Past Medical History:  Diagnosis Date  . ADHD (attention deficit hyperactivity disorder)    BP 115/75   Pulse (!) 55   Temp 98.2  F (36.8 C)   Ht 5\' 5"  (1.651 m)   Wt 259 lb 3.2 oz (117.6 kg)   SpO2 98%   BMI 43.13 kg/m   Opioid Risk Score:   Fall Risk Score:  `1  Depression screen PHQ 2/9  Depression screen [facility] 2/9 07/02/2020 04/12/2018 02/10/2018 06/03/2016 07/24/2015 06/13/2015 06/13/2015  Decreased Interest 1 2 2 2 2 2 2   Down, Depressed, Hopeless 1 3 2 2 1 1 1   PHQ - 2 Score 2 5 4 4 3 3 3   Altered sleeping - 2 1 3 3 3  -  Tired, decreased energy 3 2 2 2 3 3  -  Change in appetite 3 3 2 3 3 3  -  Feeling bad or failure about yourself  1 2 2 3 1 2  -  Trouble concentrating 1 2 3 3 3 3  -  Moving slowly or fidgety/restless 0 0 0 0 0 2 -  Suicidal thoughts 0 0 1 0 0 0 -  PHQ-9 Score - 16 15 18 16 19  -  Difficult doing work/chores Somewhat difficult - - - Somewhat difficult - -     Review of Systems  Respiratory: Positive for shortness of breath.   All other systems reviewed and  are negative.      Objective:   Physical Exam Constitutional: No distress . Vital signs reviewed. HENT: Normocephalic.  Atraumatic. Eyes: EOMI. No discharge. Cardiovascular: No JVD.   Respiratory: Normal effort.  No stridor.   GI: Non-distended.   Skin: Warm and dry.  Intact. Psych: Normal mood.  Normal behavior. Musc: No edema in extremities.  No tenderness in extremities. Neuro: Alert Motor: 5/5 throughout    Assessment & Plan:  Male with pmh ADHD presents with prolonged post covid syndrome.   1. Chronic post-covid syndrome  CXR 04/2020 unremarkable  Labs reviewed  Referral information reviewed - post covid syndrome  Will consider pulmonary rehab  Will order Albuterol  Patient states main goal is to exercise  Will order IS  Will refer to Pulm.   2. Sleep disturbance  Normal  3. Morbid Obesity  Not interested in seeing dietitian  Will reconsider if no improvement with increased exercise tolerance

## 2020-07-05 ENCOUNTER — Ambulatory Visit: Payer: Managed Care, Other (non HMO) | Admitting: Physical Medicine & Rehabilitation

## 2020-08-09 ENCOUNTER — Ambulatory Visit: Payer: Managed Care, Other (non HMO) | Admitting: Physical Medicine & Rehabilitation

## 2020-09-25 ENCOUNTER — Ambulatory Visit (INDEPENDENT_AMBULATORY_CARE_PROVIDER_SITE_OTHER): Payer: Managed Care, Other (non HMO) | Admitting: Nurse Practitioner

## 2020-09-25 ENCOUNTER — Encounter: Payer: Self-pay | Admitting: Nurse Practitioner

## 2020-09-25 ENCOUNTER — Other Ambulatory Visit: Payer: Self-pay

## 2020-09-25 VITALS — BP 124/76 | HR 72 | Temp 98.7°F | Wt 256.8 lb

## 2020-09-25 DIAGNOSIS — F909 Attention-deficit hyperactivity disorder, unspecified type: Secondary | ICD-10-CM | POA: Diagnosis not present

## 2020-09-25 DIAGNOSIS — R0602 Shortness of breath: Secondary | ICD-10-CM

## 2020-09-25 NOTE — Assessment & Plan Note (Signed)
Shortness of breath with exertion since Covid-19 in December 2020. He went to a long haul clinic once this past December and was referred to pulmonology, however he never heard from them. Will refer to pulmonology. Will follow-up within the next 3 months for shortness of breath and CPE.

## 2020-09-25 NOTE — Progress Notes (Signed)
Established Patient Office Visit  Subjective:  Patient ID: Peter Petersen, male    DOB: [birth year]  Age: 25 y.o. MRN: [id number]  CC:  Chief Complaint  Patient presents with  . ADHD    Follow up, patient states his adhd has gotten worse and unmanageable. Hasn't taken medicine in years.   . post covid    Patient states he had COVID in 06/2019. States since then he occasionally feels shortness of breath. Patient is taking albuterol inhaler for shortness of breath episodes.      HPI Peter Petersen presents for ADHD and shortness of breath since his covid-19 infection in 06/2019.   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 He has been feeling more forgetful. He was diagnosed with ADHD in 3rd grade.   ADHD  ADHD status: worse Satisfied with current therapy: no Previous psychiatry evaluation: no Previous medications: unsure the name Difficulty sustaining attention/completing tasks: yes Distracted by extraneous stimuli: yes Does not listen when spoken to: no  Fidgets with hands or feet: yes Unable to stay in seat: no   SHORTNESS OF BREATH  Shortness of breath after covid-19 infection in December 2020. His shortness of breath is worse with walking and exertion. Still able to use elliptical at the gym. He stated he had a chest xray which didn't scarring. He has an albuterol inhaler that he uses as needed and is concerned he is going to run out and doesn't have any refills. He denies coughing, nasal congestion, and chest pain.     Past Medical History:  Diagnosis Date  . ADHD (attention deficit hyperactivity disorder)     Past Surgical History:  Procedure Laterality Date  . DENTAL SURGERY Bilateral 2019  . TONSILLECTOMY      Family History  Problem Relation Age of Onset  . Healthy Mother   . Healthy Father   . Heart disease Maternal Grandfather   . Cancer Neg Hx   . Diabetes Neg Hx   . Hypertension Neg Hx   . Stroke Neg Hx     Social  History   Socioeconomic History  . Marital status: Single    Spouse name: Not on file  . Number of children: Not on file  . Years of education: Not on file  . Highest education level: Not on file  Occupational History  . Not on file  Tobacco Use  . Smoking status: Never Smoker  . Smokeless tobacco: Never Used  Vaping Use  . Vaping Use: Never used  Substance and Sexual Activity  . Alcohol use: No  . Drug use: Not Currently  . Sexual activity: Not on file  Other Topics Concern  . Not on file  Social History Narrative  . Not on file   Social Determinants of Health   Financial Resource Strain: Not on file  Food Insecurity: Not on file  Transportation Needs: Not on file  Physical Activity: Not on file  Stress: Not on file  Social Connections: Not on file  Intimate Partner Violence: Not on file    Outpatient Medications Prior to Visit  Medication Sig Dispense Refill  . albuterol (VENTOLIN HFA) 108 (90 Base) MCG/ACT inhaler Inhale 2 puffs into the lungs every 4 (four) hours as needed for wheezing or shortness of breath. 18 g 0  . Spacer/Aero-Holding Chambers (AEROCHAMBER MV) inhaler Use as instructed 1 each 2   No facility-administered medications prior to visit.    No Known Allergies  ROS Review of Systems  Constitutional: Positive for activity change (due to shortness of breath). Negative for fever.  HENT: Negative.   Eyes: Negative.   Respiratory: Positive for shortness of breath (with exertion).   Cardiovascular: Negative.   Gastrointestinal: Negative.   Endocrine: Negative.   Genitourinary: Negative.   Neurological: Negative.   Psychiatric/Behavioral: Positive for decreased concentration (at work).      Objective:    Physical Exam Vitals and nursing note reviewed.  Constitutional:      Appearance: Normal appearance.  HENT:     Head: Normocephalic.  Eyes:     Conjunctiva/sclera: Conjunctivae normal.  Cardiovascular:     Rate and Rhythm: Normal rate  and regular rhythm.     Pulses: Normal pulses.     Heart sounds: Normal heart sounds.  Pulmonary:     Effort: Pulmonary effort is normal.     Breath sounds: Normal breath sounds.  Musculoskeletal:     Cervical back: Normal range of motion.  Skin:    General: Skin is warm and dry.  Neurological:     General: No focal deficit present.     Mental Status: He is alert and oriented to person, place, and time.  Psychiatric:        Mood and Affect: Mood normal.        Thought Content: Thought content normal.     Comments: Fidgeting in chair     BP 124/76   Pulse 72   Temp 98.7 F (37.1 C)   Wt 256 lb 12.8 oz (116.5 kg)   SpO2 98%   BMI 42.73 kg/m  Wt Readings from Last 3 Encounters:  09/25/20 256 lb 12.8 oz (116.5 kg)  07/02/20 259 lb 3.2 oz (117.6 kg)  05/23/20 250 lb (113.4 kg)     Lab Results  Component Value Date   TSH 1.210 05/25/2015   Lab Results  Component Value Date   WBC 6.3 04/12/2018   HGB 13.9 04/12/2018   HCT 43.2 04/12/2018   MCV 88 04/12/2018   PLT 292 04/12/2018   Lab Results  Component Value Date   NA 141 04/12/2018   K 4.3 04/12/2018   CO2 26 04/12/2018   GLUCOSE 88 04/12/2018   BUN 17 04/12/2018   CREATININE 1.02 04/12/2018   BILITOT 0.7 04/12/2018   ALKPHOS 86 04/12/2018   AST 30 04/12/2018   ALT 14 04/12/2018   PROT 6.6 04/12/2018   ALBUMIN 4.3 04/12/2018   CALCIUM 9.1 04/12/2018   Lab Results  Component Value Date   CHOL 134 04/12/2018   Lab Results  Component Value Date   HDL 38 (L) 04/12/2018   Lab Results  Component Value Date   LDLCALC 82 04/12/2018   Lab Results  Component Value Date   TRIG 70 04/12/2018      Assessment & Plan:   Problem List Items Addressed This Visit      Other   ADHD (attention deficit hyperactivity disorder) - Primary    Was referred to psychiatry back in 2017, however he did not set up an appointment. After discussing with him, will refer to psychiatry again, and can possibly set up a  virtual visit. He was unable to remember what medication he has taken in the past. Will await recommendations from psychiatry. Discussed the need for routine follow-ups.       Relevant Orders   Ambulatory referral to Psychiatry   SOB (shortness of breath) on exertion    Shortness of breath with  exertion since Covid-19 in December 2020. He went to a long haul clinic once this past December and was referred to pulmonology, however he never heard from them. Will refer to pulmonology. Will follow-up within the next 3 months for shortness of breath and CPE.       Relevant Orders   Ambulatory referral to Pulmonology      No orders of the defined types were placed in this encounter.   Follow-up: Return in about 3 months (around 12/26/2020) for physical.    Gerre Scull, NP

## 2020-09-25 NOTE — Assessment & Plan Note (Signed)
Was referred to psychiatry back in 2017, however he did not set up an appointment. After discussing with him, will refer to psychiatry again, and can possibly set up a virtual visit. He was unable to remember what medication he has taken in the past. Will await recommendations from psychiatry. Discussed the need for routine follow-ups.

## 2020-11-08 ENCOUNTER — Ambulatory Visit (INDEPENDENT_AMBULATORY_CARE_PROVIDER_SITE_OTHER): Payer: Managed Care, Other (non HMO) | Admitting: Pulmonary Disease

## 2020-11-08 ENCOUNTER — Other Ambulatory Visit: Payer: Self-pay

## 2020-11-08 ENCOUNTER — Encounter: Payer: Self-pay | Admitting: Pulmonary Disease

## 2020-11-08 VITALS — BP 118/80 | HR 70 | Temp 97.8°F | Ht 65.28 in | Wt 253.6 lb

## 2020-11-08 DIAGNOSIS — R0602 Shortness of breath: Secondary | ICD-10-CM

## 2020-11-08 DIAGNOSIS — J683 Other acute and subacute respiratory conditions due to chemicals, gases, fumes and vapors: Secondary | ICD-10-CM

## 2020-11-08 MED ORDER — FLUTICASONE FUROATE-VILANTEROL 100-25 MCG/INH IN AEPB: 1.0000 | INHALATION_SPRAY | Freq: Every day | RESPIRATORY_TRACT | 0 refills | Status: DC

## 2020-11-08 MED ORDER — FLUTICASONE FUROATE-VILANTEROL 100-25 MCG/INH IN AEPB: 1.0000 | INHALATION_SPRAY | Freq: Every day | RESPIRATORY_TRACT | Status: DC

## 2020-11-08 NOTE — Patient Instructions (Addendum)
Your oxygen level stays good while you are walking.  Potential issue is that you may have developed asthma after COVID.  We are going to get breathing test to help [place] with this.   I am giving you a trial of Breo Ellipta 1 puff daily.  You have a 2-week supply.  Let us know how you do with this so we can call the prescription in for you.  Make sure you rinse your mouth well after you use it.   We will see you back in follow-up in 4 to 6 weeks time call sooner should any new difficulties arise.

## 2020-11-08 NOTE — Progress Notes (Signed)
Subjective:    Patient ID: Peter Petersen, male    DOB: 07-25-1996, 25 y.o.   MRN: [id number]  HPI This is a 25 year old lifelong never smoker who presents for evaluation of dyspnea post COVID-19.  He is kindly referred by Vance Peper, NP.  The patient states that he has noted dyspnea on exertion since COVID.  He has had episodes where he feels like his breath is "constricted".  Dates these episodes can come "all of a sudden".  He feels that he cannot get a breath in or out.  He has been using an albuterol inhaler with no dramatic improvement.  He does not recall having had asthma as a child.  He states that he used to run track and play trombone when he was younger and this never bothered him.  He does not describe any orthopnea or paroxysmal nocturnal dyspnea.  No lingering cough post COVID.  He did receive monoclonal antibody therapy during his COVID illness because of his BMI being over 35.  He tells me that he has gained 50 pounds since he became ill with COVID due to decreased activity however looking at his records his weight is only 3 pounds up from when he was diagnosed with COVID.  He has not had any recent fevers, chills or sweats.  No sputum production or hemoptysis.  No nausea or vomiting.  No weight loss or anorexia.  He does not have military history.  He works as a Engineer, structural.  No exotic pets, has 2 dogs.  No exotic hobbies.   Review of Systems A 10 point review of systems was performed and it is as noted above otherwise negative.  Past Medical History:  Diagnosis Date  . ADHD (attention deficit hyperactivity disorder)    Past Surgical History:  Procedure Laterality Date  . DENTAL SURGERY Bilateral 2019  . TONSILLECTOMY     Family History  Problem Relation Age of Onset  . Healthy Mother   . Healthy Father   . Heart disease Maternal Grandfather   . Cancer Neg Hx   . Diabetes Neg Hx   . Hypertension Neg Hx   . Stroke Neg Hx    Social History   Tobacco Use  .  Smoking status: Never Smoker  . Smokeless tobacco: Never Used  Substance Use Topics  . Alcohol use: No   No Known Allergies Current Meds  Medication Sig  . albuterol (VENTOLIN HFA) 108 (90 Base) MCG/ACT inhaler Inhale 2 puffs into the lungs every 4 (four) hours as needed for wheezing or shortness of breath.  . Spacer/Aero-Holding Chambers (AEROCHAMBER MV) inhaler Use as instructed   Immunization History  Administered Date(s) Administered  . DTaP 01/20/1996, 03/16/1996, 05/18/1996, 12/14/1996, 12/25/1999  . HPV Quadrivalent 03/04/2011  . Hepatitis A 10/07/2006, 11/02/2007  . Hepatitis B 05/24/1996, 01/20/1996, 05/18/1996  . HiB (PRP-OMP) 01/20/1996, 03/16/1996, 05/18/1996, 12/14/1996  . IPV 01/20/1996, 03/16/1996, 05/18/1996, 12/25/1999  . MMR 12/14/1996, 12/25/1999  . Meningococcal Polysaccharide 12/06/2010  . PFIZER(Purple Top)SARS-COV-2 Vaccination 10/15/2019, 11/12/2019  . Pneumococcal Conjugate-13 07/24/1999  . Tdap 10/07/2006, 08/18/2017  . Varicella 12/14/1996, 10/07/2006       Objective:   Physical Exam BP 118/80 (BP Location: Left Arm, Patient Position: Sitting, Cuff Size: Normal)   Pulse 70   Temp 97.8 F (36.6 C) (Temporal)   Ht 5' 5.28" (1.658 m)   Wt 253 lb 9.6 oz (115 kg)   SpO2 98%   BMI 41.85 kg/m  GENERAL: Obese gentleman, no acute distress.  Fully ambulatory.  No conversational dyspnea HEAD: Normocephalic, atraumatic.  EYES: Pupils equal, round, reactive to light.  No scleral icterus.  MOUTH: Nose/mouth/throat not examined due to masking requirements for COVID 19. NECK: Supple. No thyromegaly. Trachea midline. No JVD.  No adenopathy. PULMONARY: Good air entry bilaterally.  No adventitious sounds. CARDIOVASCULAR: S1 and S2. Regular rate and rhythm.  No rubs, murmurs or gallops heard. ABDOMEN: Obese otherwise benign. MUSCULOSKELETAL: No joint deformity, no clubbing, no edema.  NEUROLOGIC: No focal deficit, no gait disturbance, speech is fluent. SKIN:  Intact,warm,dry. PSYCH: Mood and behavior normal  Ambulatory oximetry performed today: Patient maintained saturations of 97 to 99% throughout ambulation.  He was able to perform 3 laps.    Assessment & Plan:     ICD-10-CM   1. 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 Obesity, Class III, BMI 40-49.9 (morbid obesity) (HCC)  E66.01    Weight loss is recommended This issue likely impacts with regards to dyspnea   Orders Placed This Encounter  Procedures  . Pulmonary Function Test [facility] Only    Standing Status:   Future    Standing Expiration Date:   11/08/2021    Scheduling Instructions:     3 weeks    Order Specific Question:   Full PFT: includes the following: basic spirometry, spirometry pre & post bronchodilator, diffusion capacity (DLCO), lung volumes    Answer:   Full PFT    Order Specific Question:   This test can only be performed at    Answer:   [facility] ordered this encounter  Medications  . DISCONTD: fluticasone furoate-vilanterol (BREO ELLIPTA) 100-25 MCG/INH 1 puff  . fluticasone furoate-vilanterol (BREO ELLIPTA) 100-25 MCG/INH AEPB    Sig: Inhale 1 puff into the lungs daily.    Dispense:  1 each    Refill:  0    Order Specific Question:   Lot Number?    Answer:   [id number]    Order Specific Question:   Expiration Date?    Answer:   01/24/2022    Order Specific Question:   Quantity    Answer:   1   Follow-up will be in 4 to 6 weeks time he is to contact us should any new difficulties arise.  He is to also let us know if Adair Patter is  effective for him.  Renold Don, MD [facility]   *This note was dictated using voice recognition software/Dragon.  Despite best efforts to proofread, errors can occur which can change the meaning.  Any change was purely unintentional.

## 2020-11-09 ENCOUNTER — Encounter: Payer: Self-pay | Admitting: Pulmonary Disease

## 2020-11-15 ENCOUNTER — Telehealth: Payer: Self-pay | Admitting: Pulmonary Disease

## 2020-11-15 DIAGNOSIS — R0602 Shortness of breath: Secondary | ICD-10-CM

## 2020-11-15 MED ORDER — FLUTICASONE FUROATE-VILANTEROL 100-25 MCG/INH IN AEPB: 1.0000 | INHALATION_SPRAY | Freq: Every day | RESPIRATORY_TRACT | 11 refills | Status: AC

## 2020-11-15 NOTE — Telephone Encounter (Signed)
When I called Peter Petersen today to get his PFT scheduled he stated that he thinks the [facility] sample that you gave him is working and would like a Rx for [employer].  He states that he uses [facility] but it should be on file per the patient

## 2020-11-15 NOTE — Telephone Encounter (Signed)
Rx for breo 100 has been sent to preferred pharmacy, as patient feels that this medication is effective.  Patient is aware and voiced his understanding Nothing further needed at this time.

## 2020-12-10 ENCOUNTER — Telehealth: Payer: Self-pay

## 2020-12-10 NOTE — Telephone Encounter (Signed)
Patient is aware of date/time of covid test prior to PFT.  

## 2020-12-14 ENCOUNTER — Other Ambulatory Visit: Admission: RE | Admit: 2020-12-14 | Payer: Managed Care, Other (non HMO) | Source: Ambulatory Visit

## 2020-12-17 ENCOUNTER — Ambulatory Visit: Payer: Managed Care, Other (non HMO) | Attending: Pulmonary Disease

## 2020-12-24 NOTE — Progress Notes (Signed)
'@Patient'  ID: Peter Petersen, male    DOB: 1995-08-14, 25 y.o.   MRN: [id number]  Chief Complaint  Patient presents with  . Follow-up    Feels better with breo     Referring provider: Charlynne Cousins, MD  HPI: 25 year old male, never smoked. PMH significant for post-covid 19 dyspnea, ADHD, daytime somnolence, insomnia. Patient of Dr. Patsey Petersen, seen for initial consult on 11/08/20 for shortness of breath.   Previous [facility] pulmonary encounter: 11/08/20- Dr. Dorothe Petersen This is a 25 year old lifelong never smoker who presents for evaluation of dyspnea post COVID-19.  He is kindly referred by Peter Peper, NP.  The patient states that he has noted dyspnea on exertion since COVID.  He has had episodes where he feels like his breath is "constricted".  Dates these episodes can come "all of a sudden".  He feels that he cannot get a breath in or out.  He has been using an albuterol inhaler with no dramatic improvement.  He does not recall having had asthma as a child.  He states that he used to run track and play trombone when he was younger and this never bothered him.  He does not describe any orthopnea or paroxysmal nocturnal dyspnea.  No lingering cough post COVID.  He did receive monoclonal antibody therapy during his COVID illness because of his BMI being over 35.  He tells me that he has gained 50 pounds since he became ill with COVID due to decreased activity however looking at his records his weight is only 3 pounds up from when he was diagnosed with COVID.  He has not had any recent fevers, chills or sweats.  No sputum production or hemoptysis.  No nausea or vomiting.  No weight loss or anorexia.  He does not have military history.  He works as a Engineer, structural.  No exotic pets, has 2 dogs.  No exotic hobbies.  12/25/2020- Interim hx  Patient presents today for 4-6 week follow-up. During last visit he was given sample of BREO. Ordered for PFTs to assess for asthma.   [name] has helped his breathing,  especially exercise tolerance. No issues with his breathing in the evening but he works out in the morning. He carries rescue inhaler with him. He has not had to use Albuterol since he was started on ICS/LABA. He no showed for PFTs, he was unaware of apt date/time. Denies wheezing, chest tightness or cough.    No Known Allergies  Immunization History  Administered Date(s) Administered  . DTaP 01/20/1996, 03/16/1996, 05/18/1996, 12/14/1996, 12/25/1999  . HPV Quadrivalent 03/04/2011  . Hepatitis A 10/07/2006, 11/02/2007  . Hepatitis B 1996/04/14, 01/20/1996, 05/18/1996  . HiB (PRP-OMP) 01/20/1996, 03/16/1996, 05/18/1996, 12/14/1996  . IPV 01/20/1996, 03/16/1996, 05/18/1996, 12/25/1999  . MMR 12/14/1996, 12/25/1999  . Meningococcal Polysaccharide 12/06/2010  . PFIZER(Purple Top)SARS-COV-2 Vaccination 10/15/2019, 11/12/2019  . Pneumococcal Conjugate-13 07/24/1999  . Tdap 10/07/2006, 08/18/2017  . Varicella 12/14/1996, 10/07/2006    Past Medical History:  Diagnosis Date  . ADHD (attention deficit hyperactivity disorder)     Tobacco History: Social History   Tobacco Use  Smoking Status Never Smoker  Smokeless Tobacco Never Used   Counseling given: Not Answered   Outpatient Medications Prior to Visit  Medication Sig Dispense Refill  . albuterol (VENTOLIN HFA) 108 (90 Base) MCG/ACT inhaler Inhale 2 puffs into the lungs every 4 (four) hours as needed for wheezing or shortness of breath. 18 g 0  . fluticasone furoate-vilanterol (BREO ELLIPTA) 100-25 MCG/INH AEPB  Inhale 1 puff into the lungs daily. 1 each 11  . Spacer/Aero-Holding Chambers (AEROCHAMBER MV) inhaler Use as instructed 1 each 2   No facility-administered medications prior to visit.   Review of Systems  Review of Systems  Constitutional: Negative.   HENT: Negative.   Respiratory: Negative for cough, chest tightness, shortness of breath and wheezing.   Cardiovascular: Negative.    Physical Exam  BP 110/80 (BP  Location: Left Arm, Patient Position: Sitting, Cuff Size: Normal)   Pulse 71   Temp (!) 97.5 F (36.4 C) (Temporal)   Ht '5\' 6"'  (1.676 m)   Wt 256 lb 3.2 oz (116.2 kg)   SpO2 97%   BMI 41.35 kg/m  Physical Exam Constitutional:      Appearance: Normal appearance.  HENT:     Head: Normocephalic and atraumatic.     Mouth/Throat:     Mouth: Mucous membranes are moist.     Pharynx: Oropharynx is clear.  Cardiovascular:     Rate and Rhythm: Normal rate and regular rhythm.  Pulmonary:     Effort: Pulmonary effort is normal.     Breath sounds: Normal breath sounds.     Comments: CTA Skin:    General: Skin is warm and dry.  Neurological:     General: No focal deficit present.     Mental Status: He is alert and oriented to person, place, and time. Mental status is at baseline.  Psychiatric:        Mood and Affect: Mood normal.        Behavior: Behavior normal.        Thought Content: Thought content normal.        Judgment: Judgment normal.      Lab Results:  CBC    Component Value Date/Time   WBC 6.3 04/12/2018 1002   RBC 4.91 04/12/2018 1002   HGB 13.9 04/12/2018 1002   HCT 43.2 04/12/2018 1002   PLT 292 04/12/2018 1002   MCV 88 04/12/2018 1002   MCH 28.3 04/12/2018 1002   MCHC 32.2 04/12/2018 1002   RDW 12.0 (L) 04/12/2018 1002   LYMPHSABS 2.2 04/12/2018 1002   EOSABS 0.1 04/12/2018 1002   BASOSABS 0.0 04/12/2018 1002    BMET    Component Value Date/Time   NA 141 04/12/2018 1002   K 4.3 04/12/2018 1002   CL 104 04/12/2018 1002   CO2 26 04/12/2018 1002   GLUCOSE 88 04/12/2018 1002   BUN 17 04/12/2018 1002   CREATININE 1.02 04/12/2018 1002   CALCIUM 9.1 04/12/2018 1002   GFRNONAA 104 04/12/2018 1002   GFRAA 120 04/12/2018 1002    BNP No results found for: BNP  ProBNP No results found for: PROBNP  Imaging: No results found.   Assessment & Plan:   Reactive airway disease - Symptoms present since dx with Covid in December 2020. Dyspnea improved  with addition of low dose BREO. No SABA use. He is getting 30 min cardiovascular exercise 5-7 days a week. No acute symptoms of cough, chest tightness or wheezing. He needs to reschedule PFTs. Recommend using IS 5-10 deep breaths/ hour while awake. FU televisit in 2 weeks to review pulmonary function testing.    Martyn Ehrich, NP 12/25/2020

## 2020-12-25 ENCOUNTER — Other Ambulatory Visit: Payer: Self-pay

## 2020-12-25 ENCOUNTER — Ambulatory Visit (INDEPENDENT_AMBULATORY_CARE_PROVIDER_SITE_OTHER): Payer: Managed Care, Other (non HMO) | Admitting: Primary Care

## 2020-12-25 ENCOUNTER — Telehealth: Payer: Self-pay | Admitting: Primary Care

## 2020-12-25 ENCOUNTER — Encounter: Payer: Self-pay | Admitting: Primary Care

## 2020-12-25 DIAGNOSIS — J4530 Mild persistent asthma, uncomplicated: Secondary | ICD-10-CM | POA: Diagnosis not present

## 2020-12-25 DIAGNOSIS — J45909 Unspecified asthma, uncomplicated: Secondary | ICD-10-CM | POA: Insufficient documentation

## 2020-12-25 NOTE — Telephone Encounter (Signed)
Will need to call [facility] tomorrow to ask if they are able to order these or if they do have them in stock.

## 2020-12-25 NOTE — Assessment & Plan Note (Addendum)
-   Symptoms present since dx with Covid in December 2020. Dyspnea improved with addition of low dose BREO. No SABA use. He is getting 30 min cardiovascular exercise 5-7 days a week. No acute symptoms of cough, chest tightness or wheezing. He needs to reschedule PFTs. Recommend using IS 5-10 deep breaths/ hour while awake. FU televisit in 2 weeks to review pulmonary function testing.

## 2020-12-25 NOTE — Patient Instructions (Addendum)
Recommendations: -Continue Breo 1 puff daily in morning (rinse mouth after use) -Look at getting incentive spirometer - we can put in an order or you can get on amaz[employer] -Use incentive spirometer 5-10 deep breaths every hour when able   Follow-up: 2 week televisit to review PFTs with Waynetta Sandy  AND  6 months with Jayme Cloud     How to Use an Incentive Spirometer An incentive spirometer is a tool that measures how well you are filling your lungs with each breath. Learning to take long, deep breaths using this tool can help you keep your lungs clear and active. This may help to reverse or lessen your chance of developing breathing (pulmonary) problems, especially infection. You may be asked to use a spirometer:  After a surgery.  If you have a lung problem or a history of smoking.  After a long period of time when you have been unable to move or be active. If the spirometer includes an indicator to show the highest number that you have reached, your health care provider or respiratory therapist will help you set a goal. Keep a log of your progress as told by your health care provider. What are the risks?  Breathing too quickly may cause dizziness or cause you to pass out. Take your time so you do not get dizzy or light-headed.  If you are in pain, you may need to take pain medicine before doing incentive spirometry. It is harder to take a deep breath if you are having pain. How to use your incentive spirometer 1. Sit up on the edge of your bed or on a chair. 2. Hold the incentive spirometer so that it is in an upright position. 3. Before you use the spirometer, breathe out normally. 4. Place the mouthpiece in your mouth. Make sure your lips are closed tightly around it. 5. Breathe in slowly and as deeply as you can through your mouth, causing the piston or the ball to rise toward the top of the chamber. 6. Hold your breath for 3-5 seconds, or for as long as possible. ? If the spirometer includes  a coach indicator, use this to guide you in breathing. Slow down your breathing if the indicator goes above the marked areas. 7. Remove the mouthpiece from your mouth and breathe out normally. The piston or ball will return to the bottom of the chamber. 8. Rest for a few seconds, then repeat the steps 10 or more times. ? Take your time and take a few normal breaths between deep breaths so that you do not get dizzy or light-headed. ? Do this every 1-2 hours when you are awake. 9. If the spirometer includes a goal marker to show the highest number you have reached (best effort), use this as a goal to work toward during each repetition. 10. After each set of 10 deep breaths, cough a few times. This will help to make sure that your lungs are clear. ? If you have an incision on your chest or abdomen from surgery, place a pillow or a rolled-up towel firmly against the incision when you cough. This can help to reduce pain while taking deep breaths and coughing.   General tips  When you are able to get out of bed: ? Walk around often. ? Continue to take deep breaths and cough in order to clear your lungs.  Keep using the incentive spirometer until your health care provider says it is okay to stop using it. If you have been  in the hospital, you may be told to keep using the spirometer at home. Contact a health care provider if:  You are having difficulty using the spirometer.  You have trouble using the spirometer as often as instructed.  Your pain medicine is not giving enough relief for you to use the spirometer as told.  You have a fever. Get help right away if:  You develop shortness of breath.  You develop a cough with bloody mucus from the lungs.  You have fluid or blood coming from an incision site after you cough. Summary  An incentive spirometer is a tool that can help you learn to take long, deep breaths to keep your lungs clear and active.  You may be asked to use a spirometer  after a surgery, if you have a lung problem or a history of smoking, or if you have been inactive for a long period of time.  Use your incentive spirometer as instructed every 1-2 hours while you are awake.  If you have an incision on your chest or abdomen, place a pillow or a rolled-up towel firmly against your incision when you cough. This will help to reduce pain.  Get help right away if you have shortness of breath, you cough up bloody mucus, or blood comes from your incision when you cough. This information is not intended to replace advice given to you by your health care provider. Make sure you discuss any questions you have with your health care provider. Document Revised: 10/03/2019 Document Reviewed: 10/03/2019 Elsevier Patient Education  2021 [employer].     Incentive Spirometer Record  Use your incentive spirometer as told by your health care provider. Your health care provider or respiratory therapist will help you set a goal. Breathe in slowly and as deeply as you can through your mouth. This will cause the piston or the ball to rise toward the top of the chamber. Try to reach the goal marker set on the spirometer. Use this form to write down your progress. Use additional notebook paper, if needed.  Bring this form with you to your follow-up visits. My incentive spirometer goal is to reach ____________________. Date __________ Time __________ Amount reached __________ Date __________ Time __________ Amount reached __________ Date __________ Time __________ Amount reached __________ Date __________ Time __________ Amount reached __________ Date __________ Time __________ Amount reached __________ Date __________ Time __________ Amount reached __________ Date __________ Time __________ Amount reached __________ Date __________ Time __________ Amount reached __________ Date __________ Time __________ Amount reached __________ Date __________ Time __________ Amount reached  __________ Date __________ Time __________ Amount reached __________ Date __________ Time __________ Amount reached __________ Date __________ Time __________ Amount reached __________ Date __________ Time __________ Amount reached __________ Date __________ Time __________ Amount reached __________ Date __________ Time __________ Amount reached __________ Date __________ Time __________ Amount reached __________ Date __________ Time __________ Amount reached __________ Date __________ Time __________ Amount reached __________ Date __________ Time __________ Amount reached __________ Date __________ Time __________ Amount reached __________ Date __________ Time __________ Amount reached __________ Date __________ Time __________ Amount reached __________ Date __________ Time __________ Amount reached __________ Date __________ Time __________ Amount reached __________ Date __________ Time __________ Amount reached __________ This information is not intended to replace advice given to you by your health care provider. Make sure you discuss any questions you have with your health care provider. Document Revised: 10/03/2019 Document Reviewed: 10/03/2019 Elsevier Patient Education  2021 [employer].

## 2020-12-26 ENCOUNTER — Ambulatory Visit: Payer: Managed Care, Other (non HMO) | Admitting: Internal Medicine

## 2020-12-26 NOTE — Telephone Encounter (Signed)
Called and spoke with Peter Petersen  She states that what Dorothyann Gibbs called and stated about not having incentive spirometers was untrue  I gave her pt info and she is going to make sure that he can get one and if unable to honor the order will call us  Nothing further needed

## 2021-01-07 ENCOUNTER — Telehealth: Payer: Self-pay

## 2021-01-07 NOTE — Telephone Encounter (Signed)
Spoke to patient, who is requesting to cancel PFT and Covid test.  He will call back to reschedule.   Synetta Fail, please advise. Thanks

## 2021-01-07 NOTE — Telephone Encounter (Signed)
I just spoke with Britta Mccreedy in scheduling and she has CXL his PFT again. No more appts to rescheduled until we get July PFT appts

## 2021-01-07 NOTE — Telephone Encounter (Signed)
Patient is aware of below message and voiced his understanding.  Pending OV for 01/16/2021 has been canceled, as appt was to review PFT results.  Patient is aware and voiced understanding.  OV will be rescheduled once PFT is scheduled.  Nothing further needed at this time.

## 2021-01-11 ENCOUNTER — Other Ambulatory Visit: Payer: Managed Care, Other (non HMO)

## 2021-01-14 ENCOUNTER — Ambulatory Visit: Payer: Managed Care, Other (non HMO)

## 2021-01-16 ENCOUNTER — Ambulatory Visit: Payer: Managed Care, Other (non HMO) | Admitting: Primary Care

## 2021-04-09 ENCOUNTER — Telehealth (INDEPENDENT_AMBULATORY_CARE_PROVIDER_SITE_OTHER): Payer: Managed Care, Other (non HMO) | Admitting: Internal Medicine

## 2021-04-09 ENCOUNTER — Encounter: Payer: Self-pay | Admitting: Internal Medicine

## 2021-04-09 DIAGNOSIS — R41840 Attention and concentration deficit: Secondary | ICD-10-CM | POA: Diagnosis not present

## 2021-04-09 NOTE — Progress Notes (Addendum)
There were no vitals taken for this visit.   Subjective:    Patient ID: Peter Petersen, male    DOB: 29-Sep-1995, 25 y.o.   MRN: [id number]  Chief Complaint  Patient presents with   ADHD    Seen psy, Ms Leavy Cella yesterday.     HPI: Peter Petersen is a 25 y.o. male  Pt is here for a virtual visit. He hasnt seen psychiatry yet, didn't keep appt per referral notes.  He did see  a counseller - Ms Guss Bunde  per him, she didn't rx meds ADD - took meds for suchin high school last. Rx this about 7 - 8  yrs ago  not on meds since then.   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 All issues as above were discussed and addressed. Physical exam was done as above through visual confirmation on video through FaceTime. If it was felt that the patient should be evaluated in the office, they were directed there. The patient verbally consented to this visit. Location of the patient: home Location of the provider: work Those involved with this call:  Provider: Loura Pardon, MD CMA: Tristan Schroeder, CMA Front Desk/Registration: Sherlyn Lick  Time spent on call : 10 mins via cideo conference with patient and 10 mins in charting.  Chief Complaint  Patient presents with   ADHD    Seen psy, Ms Leavy Cella yesterday.     Relevant past medical, surgical, family and social history reviewed and updated as indicated. Interim medical history since our last visit reviewed. Allergies and medications reviewed and updated.  Review of Systems  Per HPI unless specifically indicated above     Objective:    There were no vitals taken for this visit.  Wt Readings from Last 3 Encounters:  12/25/20 256 lb 3.2 oz (116.2 kg)  11/08/20 253 lb 9.6 oz (115 kg)  09/25/20 256 lb 12.8 oz (116.5 kg)    Physical Exam Constitutional:      General: He is  not in acute distress.    Appearance: He is not ill-appearing, toxic-appearing or diaphoretic.  HENT:     Head: Normocephalic and atraumatic.     Nose: No congestion or rhinorrhea.  Neurological:     Mental Status: He is alert.  Psychiatric:        Mood and Affect: Mood normal.        Behavior: Behavior normal.        Thought Content: Thought content normal.        Judgment: Judgment normal.    Results for orders placed or performed during the hospital encounter of 03/04/20  SARS CORONAVIRUS 2 (TAT 6-24 HRS) Nasopharyngeal Nasopharyngeal Swab   Specimen: Nasopharyngeal Swab  Result Value Ref Range   SARS Coronavirus 2 NEGATIVE NEGATIVE        Current Outpatient Medications:    albuterol (VENTOLIN HFA) 108 (90 Base) MCG/ACT inhaler, Inhale 2 puffs into the lungs every 4 (four) hours as needed for wheezing or shortness of breath., Disp: 18 g, Rfl: 0   fluticasone furoate-vilanterol (BREO ELLIPTA) 100-25 MCG/INH AEPB, Inhale 1 puff into the lungs daily., Disp: 1 each, Rfl: 11   Spacer/Aero-Holding Chambers (AEROCHAMBER MV) inhaler, Use as instructed, Disp: 1 each, Rfl: 2    Assessment & Plan:  ADD PMH of such unable to concentrate at current time at his job, has been  off of meds x 7-8 yrs, unsure of diagnosis at this time. He doesn't remember which meds were rx in the apst either.   Will need to see psychiatry to make diagnosis. Didn't keep last appt sec to work related issues.  Pt verbalizes understanding of such.  Will fu with them asap.   Problem List Items Addressed This Visit   None    Orders Placed This Encounter  Procedures   Ambulatory referral to Psychiatry     No orders of the defined types were placed in this encounter.    Follow up plan: No follow-ups on file.

## 2021-07-03 ENCOUNTER — Other Ambulatory Visit: Payer: Self-pay

## 2021-07-03 ENCOUNTER — Ambulatory Visit
Admission: EM | Admit: 2021-07-03 | Discharge: 2021-07-03 | Disposition: A | Discharge status: Home / Self Care | Payer: Managed Care, Other (non HMO) | Attending: Emergency Medicine | Admitting: Emergency Medicine

## 2021-07-03 DIAGNOSIS — J029 Acute pharyngitis, unspecified: Secondary | ICD-10-CM | POA: Diagnosis not present

## 2021-07-03 DIAGNOSIS — Z20822 Contact with and (suspected) exposure to covid-19: Secondary | ICD-10-CM | POA: Diagnosis not present

## 2021-07-03 DIAGNOSIS — J111 Influenza due to unidentified influenza virus with other respiratory manifestations: Secondary | ICD-10-CM | POA: Diagnosis not present

## 2021-07-03 DIAGNOSIS — R509 Fever, unspecified: Secondary | ICD-10-CM | POA: Insufficient documentation

## 2021-07-03 DIAGNOSIS — R5383 Other fatigue: Secondary | ICD-10-CM | POA: Diagnosis not present

## 2021-07-03 DIAGNOSIS — Z8616 Personal history of COVID-19: Secondary | ICD-10-CM | POA: Diagnosis not present

## 2021-07-03 DIAGNOSIS — R52 Pain, unspecified: Secondary | ICD-10-CM | POA: Diagnosis not present

## 2021-07-03 MED ORDER — ACETAMINOPHEN 325 MG PO TABS
650.0000 mg | ORAL_TABLET | Freq: Once | ORAL | Status: AC
  Administered 2021-07-03: 650 mg via ORAL

## 2021-07-03 NOTE — Discharge Instructions (Signed)
Isolate at home pending the results of your COVID test.  If you test positive then you will have to quarantine for 5 days from the start of your symptoms.  After 5 days you can break quarantine if your symptoms have improved and you have not had a fever for 24 hours without taking Tylenol or ibuprofen.  Use over-the-counter Tylenol and ibuprofen as needed for body aches and fever.  If you develop any increased shortness of breath-especially at rest, you are unable to speak in full sentences, or is a late sign your lips are turning blue you need to go the ER for evaluation.  

## 2021-07-03 NOTE — ED Provider Notes (Signed)
[facility]    CSN: [id number] Arrival date & time: 07/03/21  1830      History   Chief Complaint Chief Complaint  Patient presents with   Fatigue   Generalized Body Aches   Fever    HPI Peter Petersen is a 25 y.o. male.   HPI  25 year old male here for evaluation of fever.  Patient reports that today he developed a fever with a T-max at home of 101, he is currently 103.1 here at the urgent care, sore throat, nausea, body aches, and fatigue.  He denies runny nose or nasal congestion, ear pain, cough, shortness breath or wheezing, vomiting, or diarrhea.  He reports that his girlfriend was evaluated over the weekend and tested positive for COVID.  He is here because he is concerned that he may have COVID and is requesting testing.  Patient reports that he was evaluated several weeks ago at [facility] and was treated for influenza despite having a negative influenza test.  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   Somnolence, daytime 07/09/2015   Vitamin D deficiency 06/19/2015   Preventative health care 06/13/2015   Blepharospasm 06/13/2015   Screening for HIV without presence of risk factors 06/13/2015   Screen for STD (sexually transmitted disease) 06/13/2015   Depression, major, single episode, moderate (HCC) 06/13/2015   Low HDL (under 40) 05/26/2015   Migraine without aura 05/25/2015   ADHD (attention deficit hyperactivity disorder) 05/25/2015   Insomnia 05/25/2015   Obesity (BMI 35.0-39.9 without comorbidity) 05/25/2015   Goiter 05/25/2015    Past Surgical History:  Procedure Laterality Date   DENTAL SURGERY Bilateral 2019   TONSILLECTOMY         Home Medications    Prior to Admission medications    Medication Sig Start Date End Date Taking? Authorizing Provider  albuterol (VENTOLIN HFA) 108 (90 Base) MCG/ACT inhaler Inhale 2 puffs into the lungs every 4 (four) hours as needed for wheezing or shortness of breath. 07/02/20   Marcello Fennel, MD  fluticasone furoate-vilanterol (BREO ELLIPTA) 100-25 MCG/INH AEPB Inhale 1 puff into the lungs daily. 11/15/20   Salena Saner, MD  Spacer/Aero-Holding Chambers (AEROCHAMBER MV) inhaler Use as instructed 05/23/20   Becky Augusta, NP  DULoxetine (CYMBALTA) 20 MG capsule Take 1 capsule (20 mg total) by mouth daily. 04/12/18 03/04/20  Particia Nearing, PA-C  SUMAtriptan (IMITREX) 50 MG tablet Take 1 tablet (50 mg total) by mouth every 2 (two) hours as needed for migraine. May repeat in 2 hours if headache persists or recurs. 02/10/18 03/04/20  Particia Nearing, PA-C    Family History Family History  Problem Relation Age of Onset   Healthy Mother    Healthy Father    Heart disease Maternal Grandfather    Cancer Neg Hx    Diabetes Neg Hx    Hypertension Neg Hx    Stroke Neg Hx     Social History Social History   Tobacco Use   Smoking status: Never   Smokeless tobacco: Never  Vaping Use   Vaping Use: Never used  Substance Use Topics   Alcohol use: No   Drug use: Not Currently    Types: Marijuana    Comment: occasionally  Allergies   Patient has no known allergies.   Review of Systems Review of Systems  Constitutional:  Positive for fatigue and fever. Negative for activity change and appetite change.  HENT:  Positive for sore throat. Negative for congestion, ear pain and rhinorrhea.   Respiratory:  Negative for cough, shortness of breath and wheezing.   Gastrointestinal:  Positive for nausea. Negative for diarrhea and vomiting.  Musculoskeletal:  Positive for arthralgias and myalgias.  Skin:  Negative for rash.  Neurological:  Negative for headaches.  Hematological: Negative.     Physical Exam Triage Vital  Signs ED Triage Vitals  Enc Vitals Group     BP 07/03/21 1841 129/87     Pulse Rate 07/03/21 1841 (!) 124     Resp 07/03/21 1841 16     Temp 07/03/21 1841 (!) 103.1 F (39.5 C)     Temp Source 07/03/21 1841 Oral     SpO2 07/03/21 1841 100 %     Weight --      Height --      Head Circumference --      Peak Flow --      Pain Score 07/03/21 1839 5     Pain Loc --      Pain Edu? --      Excl. in GC? --    No data found.  Updated Vital Signs BP 129/87 (BP Location: Left Arm)   Pulse (!) 124   Temp (!) 103.1 F (39.5 C) (Oral)   Resp 16   SpO2 100%   Visual Acuity Right Eye Distance:   Left Eye Distance:   Bilateral Distance:    Right Eye Near:   Left Eye Near:    Bilateral Near:     Physical Exam Vitals and nursing note reviewed.  Constitutional:      General: He is not in acute distress.    Appearance: Normal appearance. He is normal weight. He is ill-appearing.  HENT:     Head: Normocephalic and atraumatic.     Right Ear: Tympanic membrane, ear canal and external ear normal. There is no impacted cerumen.     Left Ear: Tympanic membrane, ear canal and external ear normal. There is no impacted cerumen.     Nose: Nose normal. No congestion or rhinorrhea.     Mouth/Throat:     Mouth: Mucous membranes are moist.     Pharynx: Oropharynx is clear. No posterior oropharyngeal erythema.  Cardiovascular:     Rate and Rhythm: Normal rate and regular rhythm.     Pulses: Normal pulses.     Heart sounds: Normal heart sounds. No murmur heard.   No gallop.  Pulmonary:     Effort: Pulmonary effort is normal.     Breath sounds: Normal breath sounds. No wheezing, rhonchi or rales.  Musculoskeletal:     Cervical back: Normal range of motion and neck supple.  Lymphadenopathy:     Cervical: No cervical adenopathy.  Skin:    General: Skin is warm and dry.     Capillary Refill: Capillary refill takes less than 2 seconds.     Findings: No erythema or rash.  Neurological:      General: No focal deficit present.     Mental Status: He is alert and oriented to person, place, and time.  Psychiatric:        Mood and Affect: Mood normal.        Behavior: Behavior normal.  Thought Content: Thought content normal.        Judgment: Judgment normal.     UC Treatments / Results  Labs (all labs ordered are listed, but only abnormal results are displayed) Labs Reviewed  SARS CORONAVIRUS 2 (TAT 6-24 HRS)    EKG   Radiology No results found.  Procedures Procedures (including critical care time)  Medications Ordered in UC Medications  acetaminophen (TYLENOL) tablet 650 mg (650 mg Oral Given 07/03/21 1845)    Initial Impression / Assessment and Plan / UC Course  I have reviewed the triage vital signs and the nursing notes.  Pertinent labs & imaging results that were available during my care of the patient were reviewed by me and considered in my medical decision making (see chart for details).  Patient is a pleasant though ill-appearing 25 year old male here for evaluation of fever with associated sore throat, nausea, body aches, and fatigue that started today.  He denies any other upper or lower respiratory symptoms.  Denies vomiting or diarrhea.  His girlfriend was evaluated 5 days ago and treated for a sinus infection.  She found out 2 days ago that she tested positive for COVID at that visit.  He reports that he has been around her and he has not been quarantining or isolating.  He is concerned that he has COVID again today.  Patient has a significant fever here in clinic of 103.1.  On physical exam patient is pearly gray tympanic membranes bilaterally with normal light reflex and clear external auditory canals.  Nasal mucosa is pink and moist without erythema, edema, or discharge.  Oropharyngeal exam is benign.  No cervical lymphadenopathy appreciated exam.  Cardiopulmonary exam reveals clear lung sounds all fields.  Will swab patient for COVID and discharged  home to isolate pending the results.  If he test positive for COVID I will start him on Provera as we do not have any recent blood work in our system.  If he test negative for COVID I will treat him for influenza with Tamiflu.  ER precautions reviewed with patient.  Work note provided.   Final Clinical Impressions(s) / UC Diagnoses   Final diagnoses:  Influenza-like illness     Discharge Instructions      Isolate at home pending the results of your COVID test.  If you test positive then you will have to quarantine for 5 days from the start of your symptoms.  After 5 days you can break quarantine if your symptoms have improved and you have not had a fever for 24 hours without taking Tylenol or ibuprofen.  Use over-the-counter Tylenol and ibuprofen as needed for body aches and fever.  If you develop any increased shortness of breath-especially at rest, you are unable to speak in full sentences, or is a late sign your lips are turning blue you need to go the ER for evaluation.      ED Prescriptions   None    PDMP not reviewed this encounter.   Becky Augusta, NP 07/03/21 1925

## 2021-07-03 NOTE — ED Triage Notes (Signed)
Patient presents to Urgent Care with complaints of fever, body aches, and fatigue since today. He states his gf tested positive for covid.

## 2021-07-04 ENCOUNTER — Telehealth (INDEPENDENT_AMBULATORY_CARE_PROVIDER_SITE_OTHER): Payer: Managed Care, Other (non HMO) | Admitting: Internal Medicine

## 2021-07-04 ENCOUNTER — Telehealth: Payer: Self-pay | Admitting: Emergency Medicine

## 2021-07-04 ENCOUNTER — Encounter: Payer: Self-pay | Admitting: Internal Medicine

## 2021-07-04 VITALS — Temp 101.2°F

## 2021-07-04 DIAGNOSIS — R509 Fever, unspecified: Secondary | ICD-10-CM | POA: Diagnosis not present

## 2021-07-04 DIAGNOSIS — J029 Acute pharyngitis, unspecified: Secondary | ICD-10-CM

## 2021-07-04 LAB — SARS CORONAVIRUS 2 (TAT 6-24 HRS): SARS Coronavirus 2: NEGATIVE

## 2021-07-04 MED ORDER — FEXOFENADINE HCL 180 MG PO TABS: 180.0000 mg | ORAL_TABLET | Freq: Every day | ORAL | 1 refills | Status: AC

## 2021-07-04 MED ORDER — OSELTAMIVIR PHOSPHATE 75 MG PO CAPS: 75.0000 mg | ORAL_CAPSULE | Freq: Two times a day (BID) | ORAL | 0 refills | Status: DC

## 2021-07-04 MED ORDER — AMOXICILLIN 500 MG PO CAPS: 500.0000 mg | ORAL_CAPSULE | Freq: Two times a day (BID) | ORAL | 0 refills | Status: AC

## 2021-07-04 NOTE — Telephone Encounter (Signed)
Patient's COVID test is negative despite his exposure to his girlfriend who tested positive.  Patient symptoms are more consistent with influenza given the high fever and I have prescribed Tamiflu and sent the prescription to the pharmacy.  Attempted to reach patient by phone to discuss his results and to inform him that had sent Tamiflu to the pharmacy.  He did not answer and I left a voicemail to have him call the clinic.

## 2021-07-04 NOTE — Progress Notes (Signed)
Temp (!) 101.2 F (38.4 C) (Oral)    Subjective:    Patient ID: Peter Petersen, male    DOB: Oct 14, 1995, 25 y.o.   MRN: [id number]  Chief Complaint  Patient presents with   URI    Pt states he has had a fever and not feeling well since last night. States that he was exposed to Covid. Went to UC yesterday, negative covid test.     HPI: Peter Petersen is a 25 y.o. male   This visit was completed via video visit through MyChart due to the restrictions of the COVID-19 pandemic. All issues as above were discussed and addressed. Physical exam was done as above through visual confirmation on video through MyChart. If it was felt that the patient should be evaluated in the office, they were directed there. The patient verbally consented to this visit. Location of the patient: home Location of the provider: work Those involved with this call:  Provider: Loura Pardon, MD CMA: Tristan Schroeder, CMA Front Desk/Registration: [employer]  Time spent on call: 15 minutes with patient face to face via video conference. More than 50% of this time was spent in counseling and coordination of care. 15 minutes total spent in review of patient's record and preparation of their chart.    URI  This is a new (fever of 101 - 102 F body aches since then tested him for Covid yesterday and flu - COVID was -ve PCR) problem. Episode onset: sore throat, cough + prodcutive of brown light green brownish phlgem temp - 101 F taking tyelnol q 4 hrly. The problem has been gradually worsening (no ear aches or pain, girl freind had a sinus infection). The maximum temperature recorded prior to his arrival was 101 - 101.9 F. Associated symptoms include headaches, nausea and a sore throat. Pertinent negatives include no abdominal pain, congestion, coughing, diarrhea, dysuria, ear pain, joint pain, joint swelling, neck pain, plugged ear sensation, rash, rhinorrhea, sinus pain, sneezing, swollen glands, vomiting or wheezing.    Chief Complaint  Patient presents with   URI    Pt states he has had a fever and not feeling well since last night. States that he was exposed to Covid. Went to UC yesterday, negative covid test.     Relevant past medical, surgical, family and social history reviewed and updated as indicated. Interim medical history since our last visit reviewed. Allergies and medications reviewed and updated.  Review of Systems  Constitutional:  Positive for chills and fever.  HENT:  Positive for sore throat. Negative for congestion, ear pain, rhinorrhea, sinus pain and sneezing.   Respiratory:  Negative for cough and wheezing.   Gastrointestinal:  Positive for nausea. Negative for abdominal pain, diarrhea and vomiting.  Genitourinary:  Negative for dysuria.  Musculoskeletal:  Negative for joint pain and neck pain.  Skin:  Negative for rash.  Neurological:  Positive for headaches.   Per HPI unless specifically indicated above     Objective:    Temp (!) 101.2 F (38.4 C) (Oral)   Wt Readings from Last 3 Encounters:  12/25/20 256 lb 3.2 oz (116.2 kg)  11/08/20 253 lb 9.6 oz (115 kg)  09/25/20 256 lb 12.8 oz (116.5 kg)    Physical Exam Constitutional:      General: He is not in acute distress.    Appearance: He is ill-appearing. He is not toxic-appearing or diaphoretic.  HENT:     Head: Normocephalic and atraumatic.     Nose:  No congestion or rhinorrhea.  Neurological:     Mental Status: He is alert.    Results for orders placed or performed in visit on 07/04/21  Rapid Strep screen([facility])   Specimen: Other   Other  Result Value Ref Range   Strep Gp A Ag, IA W/Reflex Negative Negative  Culture, Group A Strep   Other  Result Value Ref Range   Strep A Culture Comment   Veritor Flu A/B Waived  Result Value Ref Range   Influenza A Negative Negative   Influenza B Negative Negative        Current Outpatient Medications:    albuterol (VENTOLIN HFA) 108 (90 Base)  MCG/ACT inhaler, Inhale 2 puffs into the lungs every 4 (four) hours as needed for wheezing or shortness of breath., Disp: 18 g, Rfl: 0   amoxicillin (AMOXIL) 500 MG capsule, Take 1 capsule (500 mg total) by mouth 2 (two) times daily for 7 days., Disp: 14 capsule, Rfl: 0   amphetamine-dextroamphetamine (ADDERALL) 5 MG tablet, Take 5 mg by mouth daily., Disp: , Rfl:    fexofenadine (ALLEGRA ALLERGY) 180 MG tablet, Take 1 tablet (180 mg total) by mouth daily., Disp: 10 tablet, Rfl: 1   fluticasone furoate-vilanterol (BREO ELLIPTA) 100-25 MCG/INH AEPB, Inhale 1 puff into the lungs daily., Disp: 1 each, Rfl: 11   Spacer/Aero-Holding Chambers (AEROCHAMBER MV) inhaler, Use as instructed, Disp: 1 each, Rfl: 2   oseltamivir (TAMIFLU) 75 MG capsule, Take 1 capsule (75 mg total) by mouth every 12 (twelve) hours. (Patient not taking: Reported on 07/04/2021), Disp: 10 capsule, Rfl: 0    Assessment & Plan:  URI: Flu and strep/ COVID  ordered at this visit, both negative. pt advised to take Tylenol q 4- 6 hourly as needed. pt to take allegra q pm as needed and to call office if symptoms worsened pt verbalised understanding of such.     Problem List Items Addressed This Visit       Other   Sore throat - Primary   Relevant Medications   amphetamine-dextroamphetamine (ADDERALL) 5 MG tablet   amoxicillin (AMOXIL) 500 MG capsule   fexofenadine (ALLEGRA ALLERGY) 180 MG tablet   Other Relevant Orders   Rapid Strep screen([facility]) (Completed)   Veritor Flu A/B Waived (Completed)   Culture, Group A Strep (Completed)   Fever   Relevant Medications   amphetamine-dextroamphetamine (ADDERALL) 5 MG tablet   amoxicillin (AMOXIL) 500 MG capsule   fexofenadine (ALLEGRA ALLERGY) 180 MG tablet   Other Relevant Orders   Rapid Strep screen([facility]) (Completed)   Veritor Flu A/B Waived (Completed)   Culture, Group A Strep (Completed)     Orders Placed This Encounter  Procedures   Rapid Strep  screen([facility])   Culture, Group A Strep   Veritor Flu A/B Waived     Meds ordered this encounter  Medications   amoxicillin (AMOXIL) 500 MG capsule    Sig: Take 1 capsule (500 mg total) by mouth 2 (two) times daily for 7 days.    Dispense:  14 capsule    Refill:  0   fexofenadine (ALLEGRA ALLERGY) 180 MG tablet    Sig: Take 1 tablet (180 mg total) by mouth daily.    Dispense:  10 tablet    Refill:  1     Follow up plan: No follow-ups on file.

## 2021-07-09 LAB — VERITOR FLU A/B WAIVED
Influenza A: NEGATIVE
Influenza B: NEGATIVE

## 2021-07-09 LAB — CULTURE, GROUP A STREP

## 2021-07-09 LAB — RAPID STREP SCREEN (MED CTR MEBANE ONLY): Strep Gp A Ag, IA W/Reflex: NEGATIVE

## 2021-07-09 NOTE — Progress Notes (Signed)
Pt has strep on culture, please let him  know Is already on amoxicillin for such Thnx

## 2021-07-12 ENCOUNTER — Other Ambulatory Visit: Payer: Self-pay

## 2021-07-12 ENCOUNTER — Encounter: Payer: Self-pay | Admitting: Internal Medicine

## 2021-07-12 ENCOUNTER — Encounter: Payer: Self-pay | Admitting: Nurse Practitioner

## 2021-07-12 ENCOUNTER — Ambulatory Visit (INDEPENDENT_AMBULATORY_CARE_PROVIDER_SITE_OTHER): Payer: Managed Care, Other (non HMO) | Admitting: Nurse Practitioner

## 2021-07-12 VITALS — BP 124/78 | HR 55 | Temp 100.5°F

## 2021-07-12 DIAGNOSIS — R6889 Other general symptoms and signs: Secondary | ICD-10-CM

## 2021-07-12 MED ORDER — PREDNISONE 20 MG PO TABS: 40.0000 mg | ORAL_TABLET | Freq: Every day | ORAL | 0 refills | Status: AC

## 2021-07-12 MED ORDER — HYDROCOD POLST-CPM POLST ER 10-8 MG/5ML PO SUER: 5.0000 mL | Freq: Two times a day (BID) | ORAL | 0 refills | Status: AC | PRN

## 2021-07-12 NOTE — Assessment & Plan Note (Signed)
Acute post Strep throat treatment, ?flu or Covid present.  Swabs obtained and recommend self quarantine until swabs return.  At this time will send Prednisone for 5 days and Tussionex to pharmacy for symptom relief.  Recommend: - Increased rest - Increasing Fluids - Acetaminophen as needed for fever/pain.  - Salt water gargling, chloraseptic spray and throat lozenges - Mucinex.  Recommend return to office if any worsening or ongoing.

## 2021-07-12 NOTE — Patient Instructions (Signed)

## 2021-07-12 NOTE — Progress Notes (Signed)
BP 124/78    Pulse (!) 55    Temp (!) 100.5 F (38.1 C) (Oral)    SpO2 96%    Subjective:    Patient ID: Peter Petersen, male    DOB: [birth year], 25 y.o.   MRN: [id number]  HPI: Peter Petersen is a 25 y.o. male  Chief Complaint  Patient presents with   Cough    Patient states he did not have a cough when he tested positive for strep 07/04/21. Patient states he felt fine on Monday through Wednesday. Patient states he started to develop a cough on Thursday and states he is now coughing up phlegm. Patient states his energy level has been low and he is not sure if the mask has anything to do with, but nosebleeds today. Patient states he usually gets them in the Spring. Patient states he has recently started his Adderrall prescription again. Patient state he has cou   UPPER RESPIRATORY TRACT INFECTION Presents today for cough -- was recently treated for strep throat by his PCP on 07/04/21, treated with Amoxicillin for 7 days.  He was negative for Covid and flu on recent testing.  Was getting better at beginning of this week, but then on Thursday cough started.  Was given Tamiflu at [facility], but did not take due to negative flu. Worst symptom: cough Fever: yes Cough: yes Shortness of breath: ongoing issue since Covid Wheezing: no Chest pain: no Chest tightness: yes Chest congestion: yes Nasal congestion: yes Runny nose: no Post nasal drip: no Sneezing: no Sore throat:  improving Swollen glands: no Sinus pressure: no Headache:  migraine all day  Face pain: no Toothache: no Ear pain: none Ear pressure: yes bilateral Eyes red/itching:no Eye drainage/crusting: no  Vomiting: no Rash: no Fatigue: yes Sick contacts: no Strep contacts: no  Context: fluctuating Recurrent sinusitis: no Relief with OTC cold/cough medications: no  Treatments attempted: continues on Breo, Albuterol, abx  Relevant past medical, surgical, family and social history reviewed and updated as indicated. Interim  medical history since our last visit reviewed. Allergies and medications reviewed and updated.  Review of Systems  Constitutional:  Positive for chills, fatigue and fever. Negative for activity change, appetite change and diaphoresis.  HENT:  Positive for postnasal drip, rhinorrhea and sinus pressure. Negative for congestion, ear discharge, ear pain, sinus pain, sneezing, sore throat and voice change.   Respiratory:  Positive for cough, chest tightness and wheezing. Negative for shortness of breath.   Cardiovascular:  Negative for chest pain, palpitations and leg swelling.  Gastrointestinal: Negative.   Skin: Negative.   Neurological:  Positive for headaches. Negative for dizziness, syncope, weakness, light-headedness and numbness.  Psychiatric/Behavioral: Negative.     Per HPI unless specifically indicated above     Objective:    BP 124/78    Pulse (!) 55    Temp (!) 100.5 F (38.1 C) (Oral)    SpO2 96%   Wt Readings from Last 3 Encounters:  12/25/20 256 lb 3.2 oz (116.2 kg)  11/08/20 253 lb 9.6 oz (115 kg)  09/25/20 256 lb 12.8 oz (116.5 kg)    Physical Exam Vitals and nursing note reviewed.  Constitutional:      General: He is awake. He is not in acute distress.    Appearance: He is well-developed and well-groomed. He is obese. He is ill-appearing. He is not toxic-appearing.  HENT:     Head: Normocephalic and atraumatic.     Right Ear: Hearing, ear canal and  external ear normal. No drainage. A middle ear effusion is present.     Left Ear: Hearing, ear canal and external ear normal. No drainage. A middle ear effusion is present.     Nose: Rhinorrhea present. Rhinorrhea is clear.     Right Sinus: No maxillary sinus tenderness or frontal sinus tenderness.     Left Sinus: No maxillary sinus tenderness or frontal sinus tenderness.     Mouth/Throat:     Mouth: Mucous membranes are moist.     Pharynx: Uvula midline. Posterior oropharyngeal erythema (with cobblestone appearance)  present. No pharyngeal swelling or oropharyngeal exudate.  Eyes:     General: Lids are normal.        Right eye: No discharge.        Left eye: No discharge.     Conjunctiva/sclera: Conjunctivae normal.     Pupils: Pupils are equal, round, and reactive to light.  Neck:     Thyroid: No thyromegaly.     Vascular: No carotid bruit.  Cardiovascular:     Rate and Rhythm: Normal rate and regular rhythm.     Heart sounds: Normal heart sounds, S1 normal and S2 normal. No murmur heard.   No gallop.  Pulmonary:     Effort: Pulmonary effort is normal. No accessory muscle usage or respiratory distress.     Breath sounds: Normal breath sounds.  Abdominal:     General: Bowel sounds are normal.     Palpations: Abdomen is soft.  Musculoskeletal:        General: Normal range of motion.     Cervical back: Normal range of motion and neck supple.     Right lower leg: No edema.     Left lower leg: No edema.  Lymphadenopathy:     Head:     Right side of head: No submental, submandibular, tonsillar, preauricular or posterior auricular adenopathy.     Left side of head: No submental, submandibular, tonsillar, preauricular or posterior auricular adenopathy.  Skin:    General: Skin is warm and dry.     Capillary Refill: Capillary refill takes less than 2 seconds.     Findings: No rash.  Neurological:     Mental Status: He is alert and oriented to person, place, and time.     Deep Tendon Reflexes: Reflexes are normal and symmetric.  Psychiatric:        Attention and Perception: Attention normal.        Mood and Affect: Mood normal.        Speech: Speech normal.        Behavior: Behavior normal. Behavior is cooperative.        Thought Content: Thought content normal.    Results for orders placed or performed in visit on 07/04/21  Rapid Strep screen([facility])   Specimen: Other   Other  Result Value Ref Range   Strep Gp A Ag, IA W/Reflex Negative Negative  Culture, Group A Strep   Other   Result Value Ref Range   Strep A Culture Comment (A)   Veritor Flu A/B Waived  Result Value Ref Range   Influenza A Negative Negative   Influenza B Negative Negative      Assessment & Plan:   Problem List Items Addressed This Visit       Other   Flu-like symptoms - Primary    Acute post Strep throat treatment, ?flu or Covid present.  Swabs obtained and recommend self quarantine until swabs return.  At this  time will send Prednisone for 5 days and Tussionex to pharmacy for symptom relief.  Recommend: - Increased rest - Increasing Fluids - Acetaminophen as needed for fever/pain.  - Salt water gargling, chloraseptic spray and throat lozenges - Mucinex.  Recommend return to office if any worsening or ongoing.      Relevant Orders   Novel Coronavirus, NAA ([facility])   Flu A+B NAA     Follow up plan: Return if symptoms worsen or fail to improve.

## 2021-07-13 ENCOUNTER — Encounter: Payer: Self-pay | Admitting: Internal Medicine

## 2021-07-13 LAB — FLU A+B NAA
Influenza A, NAA: DETECTED — AB
Influenza B, NAA: NOT DETECTED

## 2021-07-13 LAB — NOVEL CORONAVIRUS, NAA: SARS-CoV-2, NAA: NOT DETECTED

## 2021-07-13 LAB — SARS-COV-2, NAA 2 DAY TAT

## 2021-07-14 MED ORDER — OSELTAMIVIR PHOSPHATE 75 MG PO CAPS: 75.0000 mg | ORAL_CAPSULE | Freq: Two times a day (BID) | ORAL | 0 refills | Status: AC

## 2021-07-14 NOTE — Progress Notes (Signed)
Tamiflu sent and informed via MyChart.

## 2021-07-16 ENCOUNTER — Encounter: Payer: Self-pay | Admitting: Nurse Practitioner

## 2021-07-16 NOTE — Telephone Encounter (Signed)
Work note in chart waiting to be signed.

## 2021-10-18 IMAGING — CR DG CHEST 2V
2 series · 2 of 2 positions shown · non-contrast
Comparison: none

CLINICAL DATA: Patient presents to [HOSPITAL] with complaints of
for him since his diagnosis but today he felt dizzy and SOB. He
called his pcp and was instructed to come to [HOSPITAL].

EXAM:
CHEST - 2 VIEW

[chest pa]
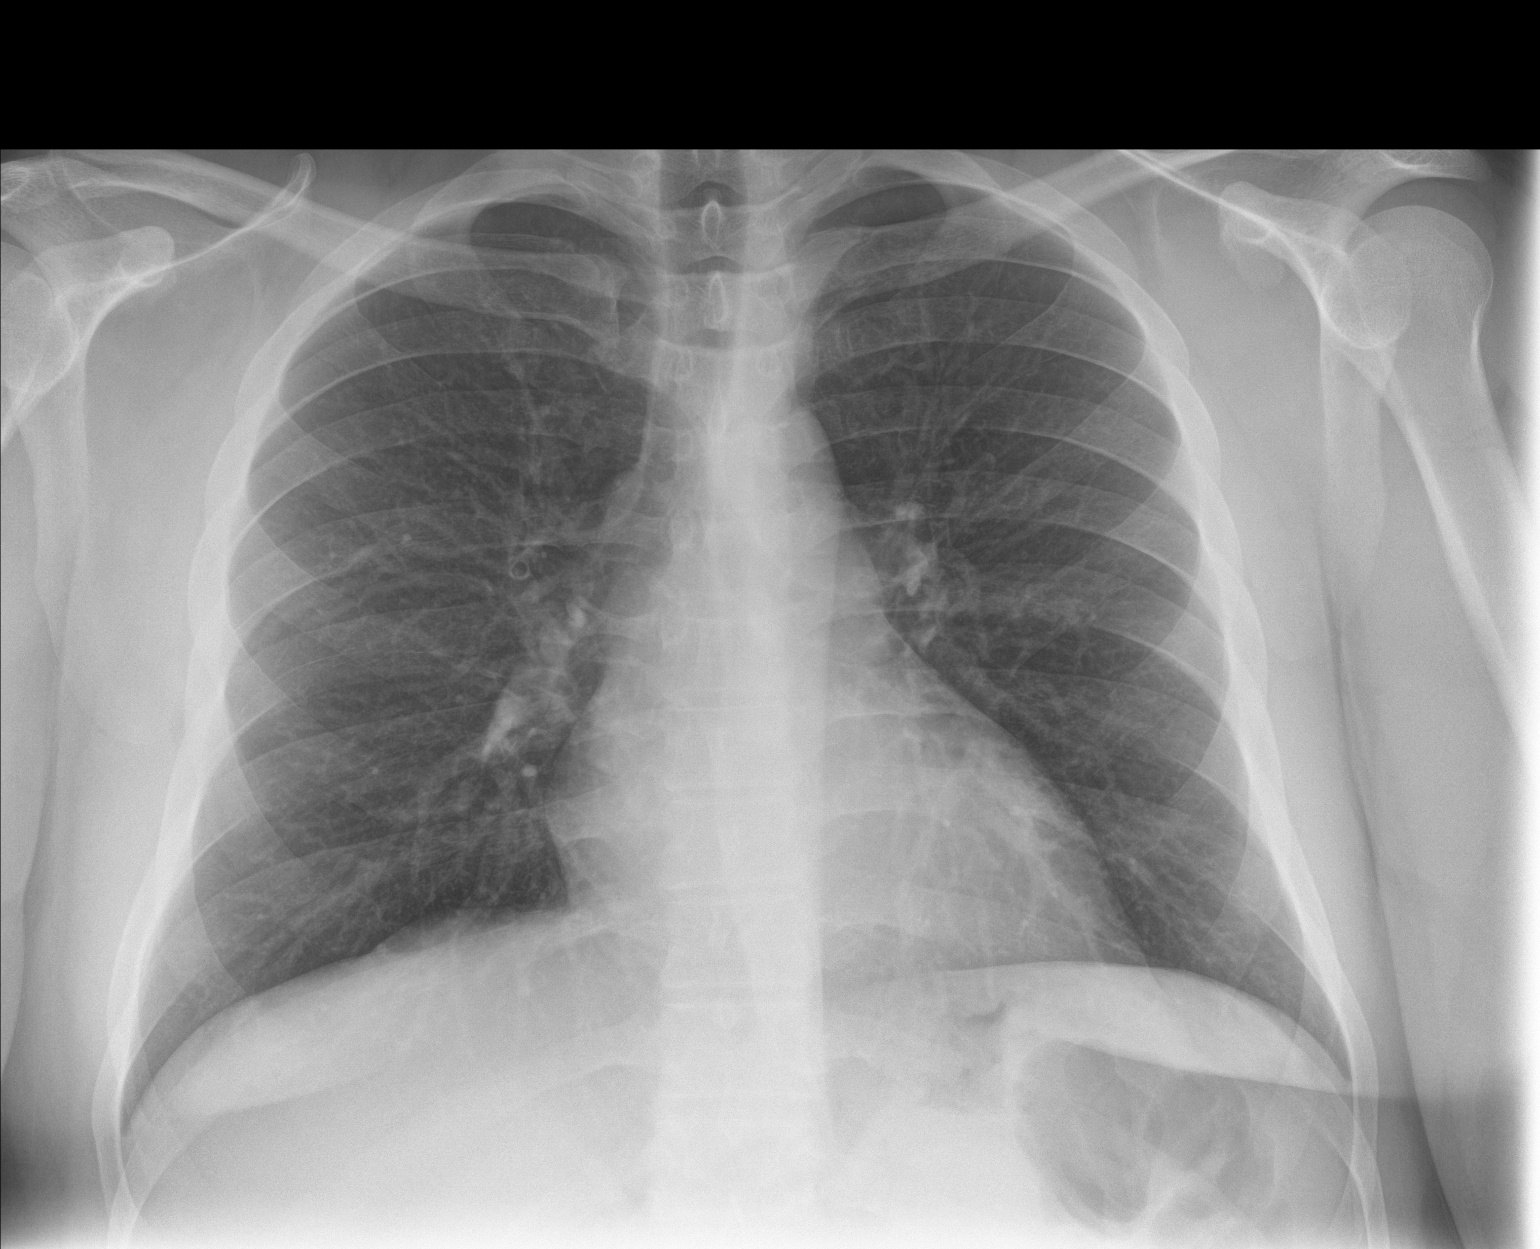

[chest lat]
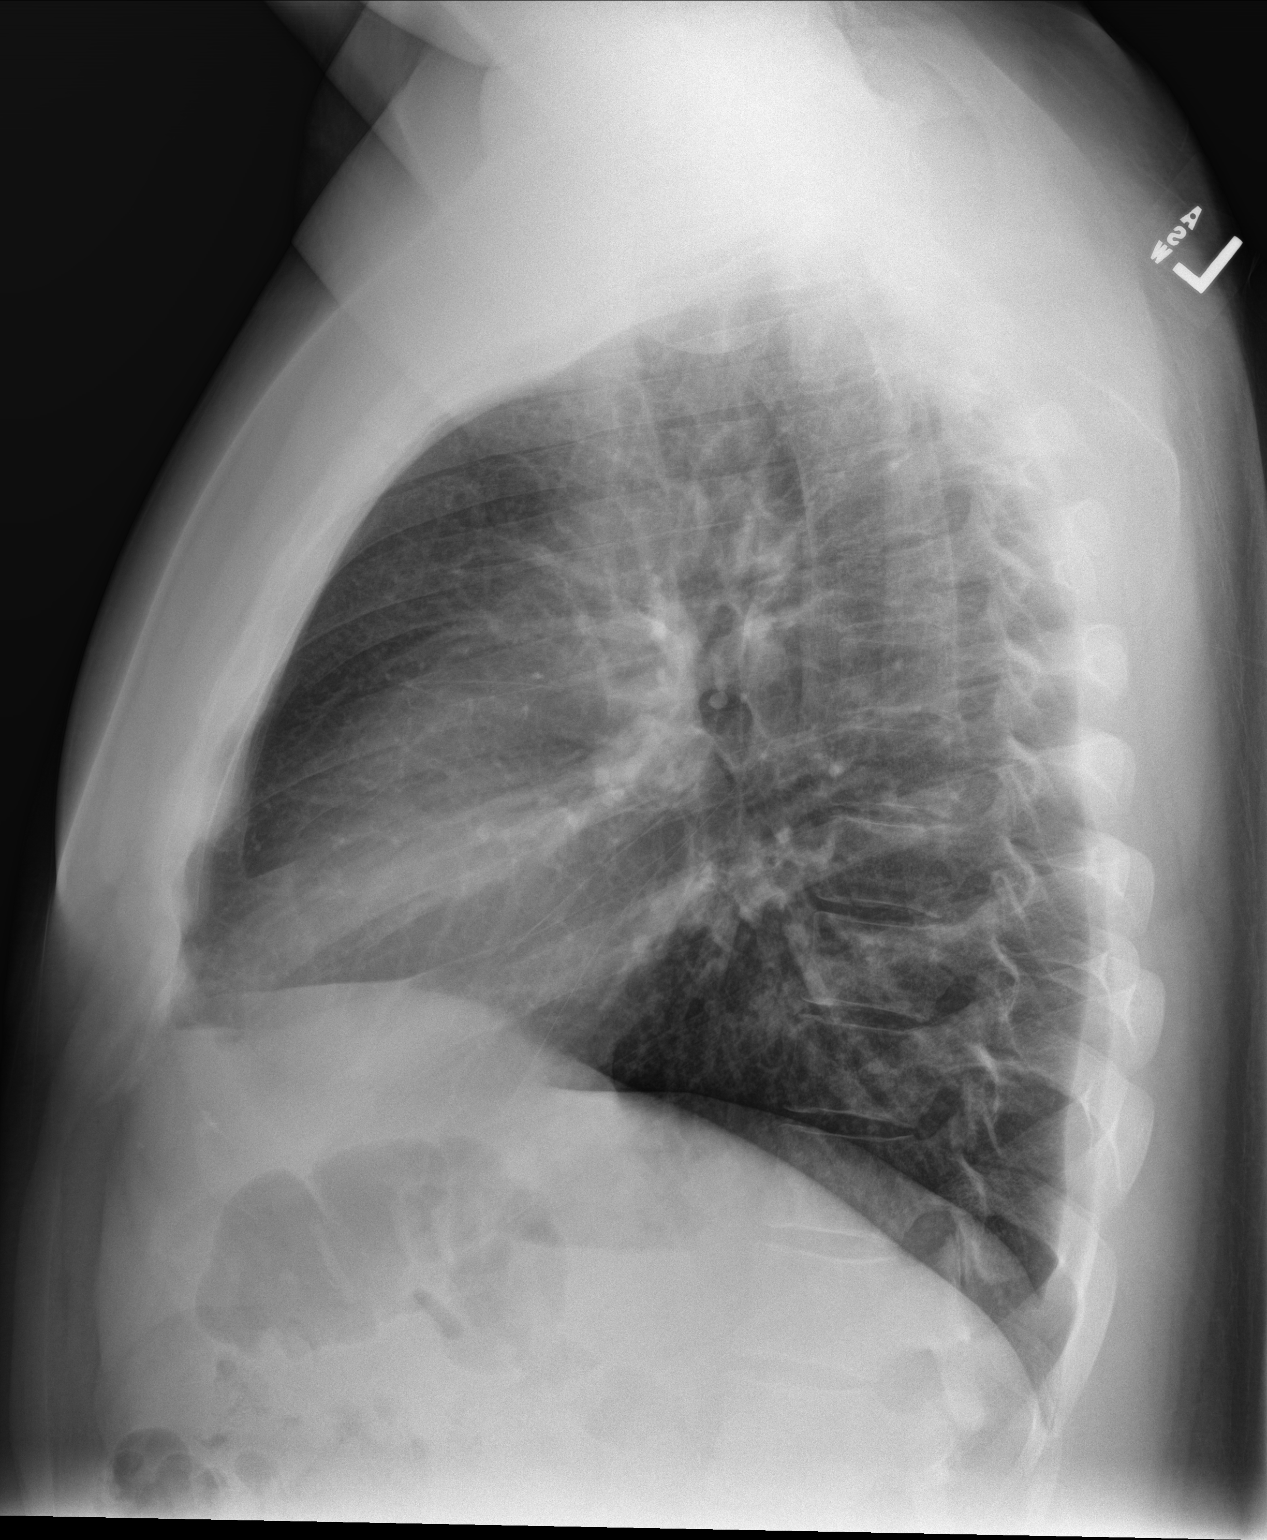

[2 of 2 positions shown; findings below may reference images not displayed]

FINDINGS: Lungs are clear.

Heart size and mediastinal contours are within normal limits.

No effusion.  No pneumothorax.

Visualized bones unremarkable.
IMPRESSION: No acute cardiopulmonary disease.

## 2022-01-02 ENCOUNTER — Ambulatory Visit (INDEPENDENT_AMBULATORY_CARE_PROVIDER_SITE_OTHER): Payer: Self-pay | Admitting: Podiatry

## 2022-01-02 ENCOUNTER — Ambulatory Visit (INDEPENDENT_AMBULATORY_CARE_PROVIDER_SITE_OTHER): Payer: Self-pay

## 2022-01-02 DIAGNOSIS — M722 Plantar fascial fibromatosis: Secondary | ICD-10-CM

## 2022-01-02 DIAGNOSIS — M79672 Pain in left foot: Secondary | ICD-10-CM

## 2022-01-07 NOTE — Progress Notes (Signed)
  Subjective:  Patient ID: Peter Petersen, male    DOB: 1995/11/07,  MRN: [id number]  Chief Complaint  Patient presents with   Foot Pain    Left foot pain near arch    26 y.o. male presents with the above complaint.  Patient presents with complaint left heel pain going for 2 months has progressive gotten worse.  Patient said there may be a little knot at the middle of the foot.  He states there is no swelling no redness.  Hurts with ambulation hurts with taking for step in the morning.  He wanted get it evaluated he has not seen anyone else prior to seeing me.  Pain scale 7 out of 10.   Review of Systems: Negative except as noted in the HPI. Denies N/V/F/Ch.  Past Medical History:  Diagnosis Date   ADHD (attention deficit hyperactivity disorder)     Current Outpatient Medications:    albuterol (VENTOLIN HFA) 108 (90 Base) MCG/ACT inhaler, Inhale 2 puffs into the lungs every 4 (four) hours as needed for wheezing or shortness of breath., Disp: 18 g, Rfl: 0   amphetamine-dextroamphetamine (ADDERALL) 5 MG tablet, Take 5 mg by mouth daily., Disp: , Rfl:    chlorpheniramine-HYDROcodone (TUSSIONEX PENNKINETIC ER) 10-8 MG/5ML SUER, Take 5 mLs by mouth every 12 (twelve) hours as needed for cough., Disp: 115 mL, Rfl: 0   fexofenadine (ALLEGRA ALLERGY) 180 MG tablet, Take 1 tablet (180 mg total) by mouth daily., Disp: 10 tablet, Rfl: 1   fluticasone furoate-vilanterol (BREO ELLIPTA) 100-25 MCG/INH AEPB, Inhale 1 puff into the lungs daily., Disp: 1 each, Rfl: 11   Spacer/Aero-Holding Chambers (AEROCHAMBER MV) inhaler, Use as instructed, Disp: 1 each, Rfl: 2  Social History   Tobacco Use  Smoking Status Never  Smokeless Tobacco Never    No Known Allergies Objective:  There were no vitals filed for this visit. There is no height or weight on file to calculate BMI. Constitutional Well developed. Well nourished.  Vascular Dorsalis pedis pulses palpable bilaterally. Posterior tibial pulses  palpable bilaterally. Capillary refill normal to all digits.  No cyanosis or clubbing noted. Pedal hair growth normal.  Neurologic Normal speech. Oriented to person, place, and time. Epicritic sensation to light touch grossly present bilaterally.  Dermatologic Nails well groomed and normal in appearance. No open wounds. No skin lesions.  Orthopedic: Normal joint ROM without pain or crepitus bilaterally. No visible deformities. Tender to palpation at the calcaneal tuber left. No pain with calcaneal squeeze left. Ankle ROM diminished range of motion left. Silfverskiold Test: positive left.   Radiographs: Taken and reviewed. No acute fractures or dislocations. No evidence of stress fracture.  Plantar heel spur absent. Posterior heel spur absent.   Assessment:   1. Plantar fasciitis of left foot    Plan:  Patient was evaluated and treated and all questions answered.  Plantar Fasciitis, left - XR reviewed as above.  - Educated on icing and stretching. Instructions given.  - Injection delivered to the plantar fascia as below. - DME: Plantar fascial brace dispensed to support the medial longitudinal arch of the foot and offload pressure from the heel and prevent arch collapse during weightbearing - Pharmacologic management: None  Procedure: Injection Tendon/Ligament Location: Left plantar fascia at the glabrous junction; medial approach. Skin Prep: alcohol Injectate: 0.5 cc 0.5% marcaine plain, 0.5 cc of 1% Lidocaine, 0.5 cc kenalog 10. Disposition: Patient tolerated procedure well. Injection site dressed with a band-aid.  No follow-ups on file.

## 2022-02-04 ENCOUNTER — Ambulatory Visit (INDEPENDENT_AMBULATORY_CARE_PROVIDER_SITE_OTHER): Payer: BLUE CROSS/BLUE SHIELD | Admitting: Podiatry

## 2022-02-04 ENCOUNTER — Encounter: Payer: Self-pay | Admitting: Podiatry

## 2022-02-04 DIAGNOSIS — M722 Plantar fascial fibromatosis: Secondary | ICD-10-CM | POA: Diagnosis not present

## 2022-02-04 DIAGNOSIS — M21862 Other specified acquired deformities of left lower leg: Secondary | ICD-10-CM | POA: Diagnosis not present

## 2022-02-04 NOTE — Progress Notes (Signed)
  Subjective:  Patient ID: Peter Petersen, male    DOB: 1995/10/19,  MRN: [id number]  Chief Complaint  Patient presents with   Plantar Fasciitis    "It's doing better.  I need another brace, my dog ate it."    26 y.o. male presents with the above complaint.  Patient presents with complaint left Planter fasciitis.  He states is doing a lot better the injection helped.  His dog ate the brace he needs another 1.  He states that the injection definitely helped.  He would like to discuss next treatment plan   Review of Systems: Negative except as noted in the HPI. Denies N/V/F/Ch.  Past Medical History:  Diagnosis Date   ADHD (attention deficit hyperactivity disorder)     Current Outpatient Medications:    albuterol (VENTOLIN HFA) 108 (90 Base) MCG/ACT inhaler, Inhale 2 puffs into the lungs every 4 (four) hours as needed for wheezing or shortness of breath., Disp: 18 g, Rfl: 0   amphetamine-dextroamphetamine (ADDERALL) 5 MG tablet, Take 5 mg by mouth daily., Disp: , Rfl:    chlorpheniramine-HYDROcodone (TUSSIONEX PENNKINETIC ER) 10-8 MG/5ML SUER, Take 5 mLs by mouth every 12 (twelve) hours as needed for cough., Disp: 115 mL, Rfl: 0   fexofenadine (ALLEGRA ALLERGY) 180 MG tablet, Take 1 tablet (180 mg total) by mouth daily., Disp: 10 tablet, Rfl: 1   fluticasone furoate-vilanterol (BREO ELLIPTA) 100-25 MCG/INH AEPB, Inhale 1 puff into the lungs daily., Disp: 1 each, Rfl: 11   Spacer/Aero-Holding Chambers (AEROCHAMBER MV) inhaler, Use as instructed, Disp: 1 each, Rfl: 2  Social History   Tobacco Use  Smoking Status Never  Smokeless Tobacco Never    No Known Allergies Objective:  There were no vitals filed for this visit. There is no height or weight on file to calculate BMI. Constitutional Well developed. Well nourished.  Vascular Dorsalis pedis pulses palpable bilaterally. Posterior tibial pulses palpable bilaterally. Capillary refill normal to all digits.  No cyanosis or clubbing  noted. Pedal hair growth normal.  Neurologic Normal speech. Oriented to person, place, and time. Epicritic sensation to light touch grossly present bilaterally.  Dermatologic Nails well groomed and normal in appearance. No open wounds. No skin lesions.  Orthopedic: Normal joint ROM without pain or crepitus bilaterally. No visible deformities. Tender to palpation at the calcaneal tuber left. No pain with calcaneal squeeze left. Ankle ROM diminished range of motion left. Silfverskiold Test: positive left.   Radiographs: Taken and reviewed. No acute fractures or dislocations. No evidence of stress fracture.  Plantar heel spur absent. Posterior heel spur absent.   Assessment:   1. Plantar fasciitis of left foot   2. Gastrocnemius equinus, left     Plan:  Patient was evaluated and treated and all questions answered.  Plantar Fasciitis, left with underlying gastrocnemius equinus - XR reviewed as above.  - Educated on icing and stretching. Instructions given.  -Second injection delivered to the plantar fascia as below. - DME: Another plantar fascial brace was dispensed as the previous 1 was damaged - Pharmacologic management: None  Procedure: Injection Tendon/Ligament Location: Left plantar fascia at the glabrous junction; medial approach. Skin Prep: alcohol Injectate: 0.5 cc 0.5% marcaine plain, 0.5 cc of 1% Lidocaine, 0.5 cc kenalog 10. Disposition: Patient tolerated procedure well. Injection site dressed with a band-aid.  No follow-ups on file.

## 2022-03-04 ENCOUNTER — Ambulatory Visit: Payer: BLUE CROSS/BLUE SHIELD | Admitting: Podiatry

## 2022-03-04 DIAGNOSIS — Q666 Other congenital valgus deformities of feet: Secondary | ICD-10-CM | POA: Diagnosis not present

## 2022-03-04 DIAGNOSIS — M722 Plantar fascial fibromatosis: Secondary | ICD-10-CM | POA: Diagnosis not present

## 2022-03-11 NOTE — Progress Notes (Signed)
  Subjective:  Patient ID: Peter Petersen, male    DOB: Mar 28, 1996,  MRN: [id number]  Chief Complaint  Patient presents with   Plantar Fasciitis    26 y.o. male presents with the above complaint.  Presents for follow-up of left Planter fasciitis.  He states he is doing a lot better the injection helped considerably.  He is here to discuss orthotics management and prevention techniques  Review of Systems: Negative except as noted in the HPI. Denies N/V/F/Ch.  Past Medical History:  Diagnosis Date   ADHD (attention deficit hyperactivity disorder)     Current Outpatient Medications:    albuterol (VENTOLIN HFA) 108 (90 Base) MCG/ACT inhaler, Inhale 2 puffs into the lungs every 4 (four) hours as needed for wheezing or shortness of breath., Disp: 18 g, Rfl: 0   amphetamine-dextroamphetamine (ADDERALL) 5 MG tablet, Take 5 mg by mouth daily., Disp: , Rfl:    chlorpheniramine-HYDROcodone (TUSSIONEX PENNKINETIC ER) 10-8 MG/5ML SUER, Take 5 mLs by mouth every 12 (twelve) hours as needed for cough., Disp: 115 mL, Rfl: 0   fexofenadine (ALLEGRA ALLERGY) 180 MG tablet, Take 1 tablet (180 mg total) by mouth daily., Disp: 10 tablet, Rfl: 1   fluticasone furoate-vilanterol (BREO ELLIPTA) 100-25 MCG/INH AEPB, Inhale 1 puff into the lungs daily., Disp: 1 each, Rfl: 11   Spacer/Aero-Holding Chambers (AEROCHAMBER MV) inhaler, Use as instructed, Disp: 1 each, Rfl: 2  Social History   Tobacco Use  Smoking Status Never  Smokeless Tobacco Never    No Known Allergies Objective:  There were no vitals filed for this visit. There is no height or weight on file to calculate BMI. Constitutional Well developed. Well nourished.  Vascular Dorsalis pedis pulses palpable bilaterally. Posterior tibial pulses palpable bilaterally. Capillary refill normal to all digits.  No cyanosis or clubbing noted. Pedal hair growth normal.  Neurologic Normal speech. Oriented to person, place, and time. Epicritic sensation to  light touch grossly present bilaterally.  Dermatologic Nails well groomed and normal in appearance. No open wounds. No skin lesions.  Orthopedic: Normal joint ROM without pain or crepitus bilaterally. No visible deformities. No tender to palpation at the calcaneal tuber left. No pain with calcaneal squeeze left. Ankle ROM diminished range of motion left. Silfverskiold Test: positive left.   Radiographs: Taken and reviewed. No acute fractures or dislocations. No evidence of stress fracture.  Plantar heel spur absent. Posterior heel spur absent.   Assessment:   1. Plantar fasciitis of left foot   2. Pes planovalgus      Plan:  Patient was evaluated and treated and all questions answered.  Plantar Fasciitis, left with underlying gastrocnemius equinus -Clinically healed and no further pain noted.  At this time patient would like to discuss orthotics option and shoe gear modification which were discussed with the patient in extensive detail.  He is officially discharged from my care if any foot and ankle issues arise in future asked him to come back and see me.  Pes planovalgus -I explained to patient the etiology of pes planovalgus and relationship with Planter fasciitis and various treatment options were discussed.  Given patient foot structure in the setting of Planter fasciitis I believe patient will benefit from custom-made orthotics to help control the hindfoot motion support the arch of the foot and take the stress away from plantar fascial.  Patient agrees with the plan like to proceed with orthotics -Patient was casted for orthotics     No follow-ups on file.

## 2022-07-02 ENCOUNTER — Telehealth: Payer: Self-pay | Admitting: Podiatry

## 2022-07-02 NOTE — Telephone Encounter (Signed)
Spoke with patient he will call back to schedule appointment to pick up his orthotics he is currently at work.  (Orthotics are currently in [place] being sent to [facility]  12/7)   Bal\ance [phone]

## 2022-07-24 ENCOUNTER — Ambulatory Visit (INDEPENDENT_AMBULATORY_CARE_PROVIDER_SITE_OTHER): Payer: BLUE CROSS/BLUE SHIELD | Admitting: Podiatry

## 2022-07-24 DIAGNOSIS — M722 Plantar fascial fibromatosis: Secondary | ICD-10-CM

## 2022-07-24 DIAGNOSIS — Q666 Other congenital valgus deformities of feet: Secondary | ICD-10-CM

## 2022-08-01 NOTE — Progress Notes (Signed)
Orthotics were picked up and is doing well.  I discussed the break-in..  If any foot and ankle issues are physical come back and see

## 2022-10-27 DIAGNOSIS — Z419 Encounter for procedure for purposes other than remedying health state, unspecified: Secondary | ICD-10-CM | POA: Diagnosis not present

## 2022-11-26 DIAGNOSIS — Z419 Encounter for procedure for purposes other than remedying health state, unspecified: Secondary | ICD-10-CM | POA: Diagnosis not present
